# Patient Record
Sex: Female | Born: 1990 | Race: White | Hispanic: Yes | Marital: Single | Smoking: Never smoker
Health system: Southern US, Community
[De-identification: ages and names within clinical notes are randomized; demographics above are authoritative.]

## PROBLEM LIST (undated history)

## (undated) DIAGNOSIS — F419 Anxiety disorder, unspecified: Secondary | ICD-10-CM

## (undated) DIAGNOSIS — J45909 Unspecified asthma, uncomplicated: Secondary | ICD-10-CM

## (undated) DIAGNOSIS — C8190 Hodgkin lymphoma, unspecified, unspecified site: Secondary | ICD-10-CM

## (undated) DIAGNOSIS — R110 Nausea: Principal | ICD-10-CM

## (undated) DIAGNOSIS — C801 Malignant (primary) neoplasm, unspecified: Secondary | ICD-10-CM

## (undated) DIAGNOSIS — G4700 Insomnia, unspecified: Principal | ICD-10-CM

## (undated) DIAGNOSIS — R300 Dysuria: Secondary | ICD-10-CM

## (undated) HISTORY — PX: WISDOM TOOTH EXTRACTION: SHX21

## (undated) HISTORY — DX: Dysuria: R30.0

## (undated) HISTORY — DX: Insomnia, unspecified: G47.00

## (undated) HISTORY — DX: Hodgkin lymphoma, unspecified, unspecified site: C81.90

## (undated) HISTORY — DX: Nausea: R11.0

---
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
## 2012-03-16 ENCOUNTER — Emergency Department (HOSPITAL_COMMUNITY)
Admission: EM | Admit: 2012-03-16 | Discharge: 2012-03-16 | Disposition: A | Discharge status: Home / Self Care | Payer: Medicaid Other | Attending: Emergency Medicine | Admitting: Emergency Medicine

## 2012-03-16 ENCOUNTER — Emergency Department (HOSPITAL_COMMUNITY)
Admission: EM | Admit: 2012-03-16 | Discharge: 2012-03-16 | Disposition: A | Discharge status: Home / Self Care | Payer: Medicaid Other | Source: Home / Self Care

## 2012-03-16 ENCOUNTER — Encounter (HOSPITAL_COMMUNITY): Payer: Self-pay | Admitting: Emergency Medicine

## 2012-03-16 DIAGNOSIS — Z0389 Encounter for observation for other suspected diseases and conditions ruled out: Secondary | ICD-10-CM | POA: Insufficient documentation

## 2012-03-16 DIAGNOSIS — J208 Acute bronchitis due to other specified organisms: Secondary | ICD-10-CM

## 2012-03-16 HISTORY — DX: Unspecified asthma, uncomplicated: J45.909

## 2012-03-16 MED ORDER — DM-GUAIFENESIN ER 30-600 MG PO TB12: 1.0000 | ORAL_TABLET | Freq: Two times a day (BID) | ORAL | Status: AC

## 2012-03-16 MED ORDER — ALBUTEROL SULFATE HFA 108 (90 BASE) MCG/ACT IN AERS: 2.0000 | INHALATION_SPRAY | Freq: Four times a day (QID) | RESPIRATORY_TRACT | Status: DC | PRN

## 2012-03-16 NOTE — Discharge Instructions (Signed)
Warm liquids, lozenges or spray to ease pain in throat. Ibuprofen for body aches and headache.  Return in 7 days if you are not improving    Acute Bronchitis Bronchitis is when the organs and tissues involved in breathing get puffy (swollen) and can leak fluid. This makes it harder for air to get in and out of the lungs. You may cough a lot and produce thick spit (mucus). Acute means the illness started suddenly. HOME CARE  Rest.   Drink enough fluids to keep the pee (urine) clear or pale yellow.   Medicines may be given that will open up your airways to help you breathe better. Only take medicine as told by your doctor.   Use a cool mist vaporizer. This will help to thin any thick spit.   Do not smoke. Avoid secondhand smoke.  GET HELP RIGHT AWAY IF:   You have a temperature by mouth above 102 F (38.9 C), not controlled by medicine.   You have chills.   You develop severe shortness of breath or chest pain.   You have bloody spit mixed with mucus (sputum).   You throw up (vomit) often.   You lose too much body fluid (dehydrated).   You have a severe headache.   You feel faint.   You do not improve after 1 week of treatment.  MAKE SURE YOU:   Understand these instructions.   Will watch your condition.   Will get help right away if you are not doing well or get worse.  Document Released: 02/24/2008 Document Revised: 08/27/2011 Document Reviewed: 09/25/2009 Exit[employer] Patient Information 2012 [employer], [place]  Viral Infections A viral infection can be caused by different types of viruses.Most viral infections are not serious and resolve on their own. However, some infections may cause severe symptoms and may lead to further complications. SYMPTOMS Viruses can frequently cause:  Minor sore throat.   Aches and pains.   Headaches.   Runny nose.   Different types of rashes.   Watery eyes.   Tiredness.   Cough.   Loss of appetite.   Gastrointestinal  infections, resulting in nausea, vomiting, and diarrhea.  These symptoms do not respond to antibiotics because the infection is not caused by bacteria. However, you might catch a bacterial infection following the viral infection. This is sometimes called a "superinfection." Symptoms of such a bacterial infection may include:  Worsening sore throat with pus and difficulty swallowing.   Swollen neck glands.   Chills and a high or persistent fever.   Severe headache.   Tenderness over the sinuses.   Persistent overall ill feeling (malaise), muscle aches, and tiredness (fatigue).   Persistent cough.   Yellow, green, or brown mucus production with coughing.  HOME CARE INSTRUCTIONS   Only take over-the-counter or prescription medicines for pain, discomfort, diarrhea, or fever as directed by your caregiver.   Drink enough water and fluids to keep your urine clear or pale yellow. Sports drinks can provide valuable electrolytes, sugars, and hydration.   Get plenty of rest and maintain proper nutrition. Soups and broths with crackers or rice are fine.  SEEK IMMEDIATE MEDICAL CARE IF:   You have severe headaches, shortness of breath, chest pain, neck pain, or an unusual rash.   You have uncontrolled vomiting, diarrhea, or you are unable to keep down fluids.   You or your child has an oral temperature above 102 F (38.9 C), not controlled by medicine.   Your baby is older than 3  months with a rectal temperature of 102 F (38.9 C) or higher.   Your baby is 29 months old or younger with a rectal temperature of 100.4 F (38 C) or higher.  MAKE SURE YOU:   Understand these instructions.   Will watch your condition.   Will get help right away if you are not doing well or get worse.  Document Released: 06/17/2005 Document Revised: 08/27/2011 Document Reviewed: 01/12/2011 [facility] Patient Information 2012 [place], [place].Marland Kitchen

## 2012-03-16 NOTE — ED Provider Notes (Signed)
History     CSN: [id number]  Arrival date & time 03/16/12  1749   First MD Initiated Contact with Patient 03/16/12 1804      Chief Complaint  Patient presents with  . Sore Throat    (Consider location/radiation/quality/duration/timing/severity/associated sxs/prior treatment) HPI Sore throat, cough with clear sputum and body aches for 3 days. No fever, runny nose, watery eyes, ear ache or fullness in ears.  She notes wheezing and had to use her brother's inhaler for it to resolve.  Her mother has been sick with same symptoms for 2-3 days and bother for 1 wk.    Past Medical History  Diagnosis Date  . Asthma     Past Surgical History  Procedure Date  . Wisdom tooth extraction     No family history on file.  History  Substance Use Topics  . Smoking status: Never Smoker   . Smokeless tobacco: Not on file  . Alcohol Use: No    OB History    Grav Para Term Preterm Abortions TAB SAB Ect Mult Living                  Review of Systems  Constitutional: Positive for appetite change and fatigue.  HENT: Positive for sore throat and trouble swallowing. Negative for hearing loss, ear pain, nosebleeds, congestion, rhinorrhea, sneezing, drooling, mouth sores, voice change, postnasal drip, sinus pressure, tinnitus and ear discharge.   Eyes: Negative.   Respiratory: Positive for cough and wheezing. Negative for shortness of breath.   Cardiovascular: Negative.   Gastrointestinal: Negative.   Genitourinary: Negative.   Musculoskeletal: Negative.   Neurological: Negative.   Psychiatric/Behavioral: Negative.     Allergies  Review of patient's allergies indicates no known allergies.  Home Medications   Current Outpatient Rx  Name Route Sig Dispense Refill  . ALBUTEROL SULFATE HFA 108 (90 BASE) MCG/ACT IN AERS Inhalation Inhale 2 puffs into the lungs every 6 (six) hours as needed for wheezing or shortness of breath. 1 Inhaler 2  . DM-GUAIFENESIN ER 30-600 MG PO TB12 Oral  Take 1 tablet by mouth every 12 (twelve) hours. 20 tablet 0    BP 127/81  Pulse 72  Temp 99.2 F (37.3 C) (Oral)  Resp 18  SpO2 97%  Physical Exam  Constitutional: She is oriented to person, place, and time. She appears well-developed and well-nourished. No distress.  HENT:  Head: Normocephalic and atraumatic.       oropharynx erythematous Normal tonsils  Eyes: EOM are normal. Pupils are equal, round, and reactive to light. Right eye exhibits no discharge. Left eye exhibits no discharge.  Cardiovascular: Normal rate and regular rhythm.   Pulmonary/Chest: Effort normal and breath sounds normal. No stridor. No respiratory distress. She has no wheezes. She has no rales. She exhibits no tenderness.  Abdominal: Soft. Bowel sounds are normal.  Lymphadenopathy:    She has no cervical adenopathy.  Neurological: She is alert and oriented to person, place, and time.  Skin: She is not diaphoretic.  Psychiatric: She has a normal mood and affect.    ED Course  Procedures (including critical care time)  Labs Reviewed - No data to display No results found.   1. Viral bronchitis       MDM  Albuterol inhaler for wheeze, delsym DM, lozenges, warm fluids, Ibuprofen for body aches and headache.         Calvert Cantor, MD 03/16/12 2008

## 2012-03-16 NOTE — ED Notes (Signed)
C/o sore throat.  Onset this past Sunday.  C/o runny cough and aches, denies fever. Reports clear phlegm.  Also reports wheezing intermittently.

## 2012-09-29 ENCOUNTER — Encounter (HOSPITAL_COMMUNITY): Payer: Self-pay | Admitting: *Deleted

## 2012-09-29 ENCOUNTER — Emergency Department (INDEPENDENT_AMBULATORY_CARE_PROVIDER_SITE_OTHER)
Admission: EM | Admit: 2012-09-29 | Discharge: 2012-09-29 | Disposition: A | Discharge status: Home / Self Care | Payer: Self-pay | Source: Home / Self Care

## 2012-09-29 DIAGNOSIS — R1031 Right lower quadrant pain: Secondary | ICD-10-CM

## 2012-09-29 DIAGNOSIS — N390 Urinary tract infection, site not specified: Secondary | ICD-10-CM

## 2012-09-29 LAB — POCT URINALYSIS DIP (DEVICE)
Bilirubin Urine: NEGATIVE
Protein, ur: 100 mg/dL — AB
Specific Gravity, Urine: 1.02 (ref 1.005–1.030)
pH: 8.5 — ABNORMAL HIGH (ref 5.0–8.0)

## 2012-09-29 MED ORDER — CEPHALEXIN 500 MG PO CAPS: 500.0000 mg | ORAL_CAPSULE | Freq: Two times a day (BID) | ORAL | Status: DC

## 2012-09-29 NOTE — ED Notes (Signed)
States she had hematuria 2 days ago and pain was 7/10 at that time. No pain now

## 2012-09-29 NOTE — ED Notes (Signed)
C/o discomfort at urethra onset 1 week ago.  No urgency or frequency.  C/o dysuria.  Has had one before and it felt like this.  C/o itching in her vaginal area with clear discharge that she says is normal for her.

## 2012-09-29 NOTE — ED Provider Notes (Signed)
History     CSN: [id number]  Arrival date & time 09/29/12  1659   None     Chief Complaint  Patient presents with  . Urinary Tract Infection    (Consider location/radiation/quality/duration/timing/severity/associated sxs/prior treatment) Patient is a 22 y.o. female presenting with urinary tract infection. The history is provided by the patient.  Urinary Tract Infection This is a new problem.   Mallory Price is a 22 y.o. female who complains of urinary frequency, urgency and dysuria x 7 days, without flank pain, fever, chills, or abnormal vaginal discharge or bleeding.   States noted blood in urine yesterday.  Has not been taking medications for symptoms.  Has history of UTI, not frequent, last UTI 1 years ago.  Denies known kidney disease or structural abnormalities.  Past Medical History  Diagnosis Date  . Asthma     Past Surgical History  Procedure Date  . Wisdom tooth extraction     History reviewed. No pertinent family history.  History  Substance Use Topics  . Smoking status: Never Smoker   . Smokeless tobacco: Not on file  . Alcohol Use: No    OB History    Grav Para Term Preterm Abortions TAB SAB Ect Mult Living                  Review of Systems  All other systems reviewed and are negative.    Allergies  Review of patient's allergies indicates no known allergies.  Home Medications   Current Outpatient Rx  Name  Route  Sig  Dispense  Refill  . ALBUTEROL SULFATE HFA 108 (90 BASE) MCG/ACT IN AERS   Inhalation   Inhale 2 puffs into the lungs every 6 (six) hours as needed for wheezing or shortness of breath.   1 Inhaler   2   . CEPHALEXIN 500 MG PO CAPS   Oral   Take 1 capsule (500 mg total) by mouth 2 (two) times daily.   14 capsule   0     BP 118/68  Pulse 92  Temp 98.2 F (36.8 C) (Oral)  Resp 16  SpO2 100%  LMP 09/07/2012  Physical Exam  Nursing note and vitals reviewed. Constitutional: She is oriented to person, place, and  time. Vital signs are normal. She appears well-developed and well-nourished. She is active and cooperative.  HENT:  Head: Normocephalic.  Eyes: Conjunctivae normal and EOM are normal. Pupils are equal, round, and reactive to light. No scleral icterus.  Neck: Trachea normal and normal range of motion. Neck supple.  Cardiovascular: Normal rate, regular rhythm, normal heart sounds and intact distal pulses.   Pulmonary/Chest: Effort normal and breath sounds normal.  Abdominal: Soft. Bowel sounds are normal. There is tenderness in the right lower quadrant. There is no rebound, no guarding and no CVA tenderness.         Negative psoas and markle sign  Neurological: She is alert and oriented to person, place, and time. No cranial nerve deficit or sensory deficit.  Skin: Skin is warm and dry.  Psychiatric: She has a normal mood and affect. Her speech is normal and behavior is normal. Judgment and thought content normal. Cognition and memory are normal.    ED Course  Procedures (including critical care time)  Labs Reviewed  POCT URINALYSIS DIP (DEVICE) - Abnormal; Notable for the following:    Ketones, ur TRACE (*)     Hgb urine dipstick MODERATE (*)     pH 8.5 (*)  Protein, ur 100 (*)     Urobilinogen, UA 2.0 (*)     Leukocytes, UA LARGE (*)  Biochemical Testing Only. Please order routine urinalysis from main lab if confirmatory testing is needed.   All other components within normal limits  POCT PREGNANCY, URINE   No results found.   1. RLQ abdominal pain   2. UTI (lower urinary tract infection)       MDM  RLQ tenderness on exam, upon further interview pt reports intermittent spasmodic pain for about one month that she believed may have been related to her ovary, although no known history of ovarian cysts.  Pain is unpredictable, no known fever, anorexia or n/v/d.  Discussed findings with patient, made aware CT scan would rule out acute abdominal etiology of pain.  Pt declines  transfer to [facility] for imaging related to lack of insurance, risks reviewed.  Medications as prescribed, increase fluids.            Johnsie Kindred, NP 09/29/12 2130

## 2012-09-30 NOTE — ED Provider Notes (Signed)
Medical screening examination/treatment/procedure(s) were performed by non-physician practitioner and as supervising physician I was immediately available for consultation/collaboration.  Dondre Catalfamo   Shunte Senseney, MD 09/30/12 1001 

## 2013-01-03 ENCOUNTER — Encounter (HOSPITAL_COMMUNITY): Payer: Self-pay | Admitting: Emergency Medicine

## 2013-01-03 ENCOUNTER — Emergency Department (INDEPENDENT_AMBULATORY_CARE_PROVIDER_SITE_OTHER)
Admission: EM | Admit: 2013-01-03 | Discharge: 2013-01-03 | Disposition: A | Discharge status: Home / Self Care | Payer: Self-pay | Source: Home / Self Care | Attending: Emergency Medicine | Admitting: Emergency Medicine

## 2013-01-03 DIAGNOSIS — N390 Urinary tract infection, site not specified: Secondary | ICD-10-CM

## 2013-01-03 LAB — POCT URINALYSIS DIP (DEVICE)
Ketones, ur: NEGATIVE mg/dL
Protein, ur: NEGATIVE mg/dL
Urobilinogen, UA: 0.2 mg/dL (ref 0.0–1.0)
pH: 8.5 — ABNORMAL HIGH (ref 5.0–8.0)

## 2013-01-03 LAB — POCT PREGNANCY, URINE: Preg Test, Ur: NEGATIVE

## 2013-01-03 MED ORDER — CIPROFLOXACIN HCL 500 MG PO TABS: 500.0000 mg | ORAL_TABLET | Freq: Two times a day (BID) | ORAL | Status: DC

## 2013-01-03 MED ORDER — PHENAZOPYRIDINE HCL 200 MG PO TABS: 200.0000 mg | ORAL_TABLET | Freq: Three times a day (TID) | ORAL | Status: DC | PRN

## 2013-01-03 NOTE — ED Provider Notes (Signed)
Chief Complaint:   Chief Complaint  Patient presents with  . Urinary Tract Infection    History of Present Illness:   Mallory Price is a 22 year old female with a two-day history of dysuria, frequency, urgency, nausea, suprapubic pain, and urethral itching and irritation. She has had 2 previous urinary tract infections, last one 2 months ago which was treated with cephalexin. A culture was not obtained at that time. She denies fever, chills, vomiting, and she has had chronic lower back pain but nothing acute or new. She denies any GYN problems.  Review of Systems:  Other than noted above, the patient denies any of the following symptoms: General:  No fevers, chills, sweats, aches, or fatigue. GI:  No abdominal pain, back pain, nausea, vomiting, diarrhea, or constipation. GU:  No dysuria, frequency, urgency, hematuria, or incontinence. GYN:  No discharge, itching, vulvar pain or lesions, pelvic pain, or abnormal vaginal bleeding.  PMFSH:  Past medical history, family history, social history, meds, and allergies were reviewed.    Physical Exam:   Vital signs:  BP 105/71  Pulse 74  Temp(Src) 98 F (36.7 C) (Oral)  Resp 16  SpO2 100%  LMP 01/01/2013 Gen:  Alert, oriented, in no distress. Lungs:  Clear to auscultation, no wheezes, rales or rhonchi. Heart:  Regular rhythm, no gallop or murmer. Abdomen:  Flat and soft. There was slight suprapubic pain to palpation.  No guarding, or rebound.  No hepato-splenomegaly or mass.  Bowel sounds were normally active.  No hernia. Back:  No CVA tenderness.  Skin:  Clear, warm and dry.  Labs:   Results for orders placed during the hospital encounter of 01/03/13  POCT URINALYSIS DIP (DEVICE)      Result Value Range   Glucose, UA NEGATIVE  NEGATIVE mg/dL   Bilirubin Urine NEGATIVE  NEGATIVE   Ketones, ur NEGATIVE  NEGATIVE mg/dL   Specific Gravity, Urine 1.015  1.005 - 1.030   Hgb urine dipstick LARGE (*) NEGATIVE   pH 8.5 (*) 5.0 - 8.0   Protein, ur NEGATIVE  NEGATIVE mg/dL   Urobilinogen, UA 0.2  0.0 - 1.0 mg/dL   Nitrite NEGATIVE  NEGATIVE   Leukocytes, UA SMALL (*) NEGATIVE  POCT PREGNANCY, URINE      Result Value Range   Preg Test, Ur NEGATIVE  NEGATIVE     Other Labs Obtained at Urgent Care Center:  A urine culture was obtained.  Results are pending at this time and we will call about any positive results.  Assessment: The encounter diagnosis was UTI (lower urinary tract infection).   This is a recurrent urinary tract infection. Since she was treated with cephalexin last time I will try Cipro this time. A culture was obtained.  Plan:   1.  The following meds were prescribed:   Discharge Medication List as of 01/03/2013  7:20 PM    START taking these medications   Details  ciprofloxacin (CIPRO) 500 MG tablet Take 1 tablet (500 mg total) by mouth every 12 (twelve) hours., Starting 01/03/2013, Until Discontinued, Normal    phenazopyridine (PYRIDIUM) 200 MG tablet Take 1 tablet (200 mg total) by mouth 3 (three) times daily as needed for pain., Starting 01/03/2013, Until Discontinued, Normal       2.  The patient was instructed in symptomatic care and handouts were given. 3.  The patient was told to return if becoming worse in any way, if no better in 3 or 4 days, and given some red flag symptoms  such as fever, chills, increasing pain, or vomiting that would indicate earlier return. 4.  The patient was told to avoid intercourse for 10 days, get extra fluids, and return for a follow up with her primary care doctor at the completion of treatment for a repeat UA and culture.     Reuben Likes, MD 01/03/13 2016

## 2013-01-03 NOTE — ED Notes (Signed)
C/o itching in urethra and burning with urination, frequency of urination onset yesterday.

## 2013-01-05 LAB — URINE CULTURE
Colony Count: 50000
Special Requests: NORMAL

## 2013-01-05 NOTE — ED Notes (Signed)
Urine culture: 50,000 colonies E. Coli. Pt. adequately treated with Cipro. Mallory Price 01/05/2013

## 2015-01-30 ENCOUNTER — Emergency Department (HOSPITAL_COMMUNITY)
Admission: EM | Admit: 2015-01-30 | Discharge: 2015-01-31 | Disposition: A | Discharge status: Home / Self Care | Payer: BLUE CROSS/BLUE SHIELD | Attending: Emergency Medicine | Admitting: Emergency Medicine

## 2015-01-30 ENCOUNTER — Encounter (HOSPITAL_COMMUNITY): Payer: Self-pay | Admitting: Emergency Medicine

## 2015-01-30 DIAGNOSIS — Z3202 Encounter for pregnancy test, result negative: Secondary | ICD-10-CM | POA: Insufficient documentation

## 2015-01-30 DIAGNOSIS — Z79899 Other long term (current) drug therapy: Secondary | ICD-10-CM | POA: Insufficient documentation

## 2015-01-30 DIAGNOSIS — R079 Chest pain, unspecified: Secondary | ICD-10-CM | POA: Diagnosis present

## 2015-01-30 DIAGNOSIS — R000 Tachycardia, unspecified: Secondary | ICD-10-CM | POA: Diagnosis not present

## 2015-01-30 DIAGNOSIS — J45901 Unspecified asthma with (acute) exacerbation: Secondary | ICD-10-CM | POA: Diagnosis not present

## 2015-01-30 DIAGNOSIS — R590 Localized enlarged lymph nodes: Secondary | ICD-10-CM | POA: Insufficient documentation

## 2015-01-30 LAB — BASIC METABOLIC PANEL
Anion gap: 11 (ref 5–15)
BUN: 6 mg/dL (ref 6–20)
CO2: 22 mmol/L (ref 22–32)
CREATININE: 0.63 mg/dL (ref 0.44–1.00)
Calcium: 8.8 mg/dL — ABNORMAL LOW (ref 8.9–10.3)
Chloride: 106 mmol/L (ref 101–111)
GLUCOSE: 110 mg/dL — AB (ref 70–99)
POTASSIUM: 3.3 mmol/L — AB (ref 3.5–5.1)
Sodium: 139 mmol/L (ref 135–145)

## 2015-01-30 LAB — CBC WITH DIFFERENTIAL/PLATELET
BASOS ABS: 0 10*3/uL (ref 0.0–0.1)
BASOS PCT: 0 % (ref 0–1)
EOS ABS: 0.1 10*3/uL (ref 0.0–0.7)
Eosinophils Relative: 2 % (ref 0–5)
HCT: 36.9 % (ref 36.0–46.0)
HEMOGLOBIN: 12.5 g/dL (ref 12.0–15.0)
Lymphocytes Relative: 26 % (ref 12–46)
Lymphs Abs: 2.3 10*3/uL (ref 0.7–4.0)
MCH: 31.1 pg (ref 26.0–34.0)
MCHC: 33.9 g/dL (ref 30.0–36.0)
MCV: 91.8 fL (ref 78.0–100.0)
Monocytes Absolute: 0.6 10*3/uL (ref 0.1–1.0)
Monocytes Relative: 6 % (ref 3–12)
NEUTROS ABS: 5.8 10*3/uL (ref 1.7–7.7)
NEUTROS PCT: 66 % (ref 43–77)
PLATELETS: 234 10*3/uL (ref 150–400)
RBC: 4.02 MIL/uL (ref 3.87–5.11)
RDW: 12.9 % (ref 11.5–15.5)
WBC: 8.8 10*3/uL (ref 4.0–10.5)

## 2015-01-30 LAB — I-STAT TROPONIN, ED: Troponin i, poc: 0 ng/mL (ref 0.00–0.08)

## 2015-01-30 LAB — D-DIMER, QUANTITATIVE: D-Dimer, Quant: 1 ug/mL-FEU — ABNORMAL HIGH (ref 0.00–0.48)

## 2015-01-30 LAB — I-STAT BETA HCG BLOOD, ED (MC, WL, AP ONLY): I-stat hCG, quantitative: <5 m[IU]/mL (ref <5)

## 2015-01-30 MED ORDER — ASPIRIN 325 MG PO TABS: 325.0000 mg | ORAL_TABLET | ORAL | Status: DC

## 2015-01-30 MED ORDER — MORPHINE SULFATE 4 MG/ML IJ SOLN
4.0000 mg | Freq: Once | INTRAMUSCULAR | Status: AC
  Administered 2015-01-30: 4 mg via INTRAVENOUS
  Filled 2015-01-30: qty 1

## 2015-01-30 MED ORDER — ONDANSETRON HCL 4 MG/2ML IJ SOLN
4.0000 mg | Freq: Once | INTRAMUSCULAR | Status: AC
  Administered 2015-01-30: 4 mg via INTRAVENOUS
  Filled 2015-01-30: qty 2

## 2015-01-30 NOTE — ED Notes (Signed)
Sent to ED to be evaluated for a PE, sent by Dr. Amedeo Kinsman. Reports ST changes.

## 2015-01-30 NOTE — ED Provider Notes (Signed)
CSN: [id number]     Arrival date & time 01/30/15  2106 History   First MD Initiated Contact with Patient 01/30/15 2151     Chief Complaint  Patient presents with  . Chest Pain    The patient said she has been having pain in her abdomen under her rib that radiates to the top of her right shoulder and to her back.    . Shortness of Breath     (Consider location/radiation/quality/duration/timing/severity/associated sxs/prior Treatment) HPI Comments: Patient is a 24 year old female presenting to the emergency department from her PCPs office for evaluation of right-sided pleuritic chest pain with radiation to her back that began last evening. Patient states her pain is worsened with inspiration and leaning forward. States she took a pain pill last evening with some improvement of her pain. She endorses associated shortness of breath, lightheadedness and dizziness. Denies any fevers, cough, nausea, vomiting. No history of DVT or PE. Patient is on NuvaRing.  Patient is a 24 y.o. female presenting with chest pain and shortness of breath.  Chest Pain Associated symptoms: shortness of breath   Shortness of Breath Associated symptoms: chest pain     Past Medical History  Diagnosis Date  . Asthma    Past Surgical History  Procedure Laterality Date  . Wisdom tooth extraction     History reviewed. No pertinent family history. History  Substance Use Topics  . Smoking status: Never Smoker   . Smokeless tobacco: Not on file  . Alcohol Use: No   OB History    No data available     Review of Systems  Respiratory: Positive for shortness of breath.   Cardiovascular: Positive for chest pain.  All other systems reviewed and are negative.     Allergies  Review of patient's allergies indicates no known allergies.  Home Medications   Prior to Admission medications   Medication Sig Start Date End Date Taking? Authorizing Provider  etonogestrel-ethinyl estradiol (NUVARING) 0.12-0.015  MG/24HR vaginal ring Place 1 each vaginally every 28 (twenty-eight) days. Insert vaginally and leave in place for 3 consecutive weeks, then remove for 1 week.   Yes Historical Provider, MD  albuterol (PROVENTIL HFA;VENTOLIN HFA) 108 (90 BASE) MCG/ACT inhaler Inhale 2 puffs into the lungs every 6 (six) hours as needed for wheezing or shortness of breath. 03/16/12 03/16/13  Debbe Odea, MD  cephALEXin (KEFLEX) 500 MG capsule Take 1 capsule (500 mg total) by mouth 2 (two) times daily. Patient not taking: Reported on 01/30/2015 09/29/12   Awilda Metro, NP  ciprofloxacin (CIPRO) 500 MG tablet Take 1 tablet (500 mg total) by mouth every 12 (twelve) hours. Patient not taking: Reported on 01/30/2015 01/03/13   Harden Mo, MD  phenazopyridine (PYRIDIUM) 200 MG tablet Take 1 tablet (200 mg total) by mouth 3 (three) times daily as needed for pain. Patient not taking: Reported on 01/30/2015 01/03/13   Harden Mo, MD   BP 108/69 mmHg  Pulse 88  Temp(Src) 98.4 F (36.9 C) (Oral)  Resp 16  Ht 5' (1.524 m)  Wt 126 lb 6.4 oz (57.335 kg)  BMI 24.69 kg/m2  SpO2 99%  LMP 01/16/2015 Physical Exam  Constitutional: She is oriented to person, place, and time. She appears well-developed and well-nourished. No distress.  HENT:  Head: Normocephalic and atraumatic.  Right Ear: External ear normal.  Left Ear: External ear normal.  Nose: Nose normal.  Mouth/Throat: Oropharynx is clear and moist.  Eyes: Conjunctivae are normal.  Neck: Normal range  of motion. Neck supple.  No nuchal rigidity.   Cardiovascular: Regular rhythm and normal heart sounds.  Tachycardia present.   Pulmonary/Chest: Effort normal and breath sounds normal. No respiratory distress. She exhibits no tenderness.  Abdominal: Soft. There is no tenderness.  Musculoskeletal: Normal range of motion. She exhibits no edema.  Neurological: She is alert and oriented to person, place, and time.  Skin: Skin is warm and dry. She is not diaphoretic.   Psychiatric: She has a normal mood and affect.  Nursing note and vitals reviewed.   ED Course  Procedures (including critical care time) Medications  morphine 4 MG/ML injection 4 mg (4 mg Intravenous Given 01/30/15 2223)  ondansetron (ZOFRAN) injection 4 mg (4 mg Intravenous Given 01/30/15 2223)  iohexol (OMNIPAQUE) 350 MG/ML injection 100 mL (100 mLs Intravenous Contrast Given 01/31/15 0032)  ketorolac (TORADOL) 30 MG/ML injection 30 mg (30 mg Intravenous Given 01/31/15 0101)    Labs Review Labs Reviewed  BASIC METABOLIC PANEL - Abnormal; Notable for the following:    Potassium 3.3 (*)    Glucose, Bld 110 (*)    Calcium 8.8 (*)    All other components within normal limits  D-DIMER, QUANTITATIVE - Abnormal; Notable for the following:    D-Dimer, Quant 1.00 (*)    All other components within normal limits  CBC WITH DIFFERENTIAL/PLATELET  I-STAT TROPOININ, ED  I-STAT BETA HCG BLOOD, ED ([facility], [facility], [facility] ONLY)    Imaging Review Ct Angio Chest Pe W/cm &/or Wo Cm  01/31/2015   CLINICAL DATA:  Abdominal pain under the rib radiating to the top of the right shoulder and to the back. Shortness of breath, dizziness, and lightheadedness. Elevated D-dimer.  EXAM: CT ANGIOGRAPHY CHEST WITH CONTRAST  TECHNIQUE: Multidetector CT imaging of the chest was performed using the standard protocol during bolus administration of intravenous contrast. Multiplanar CT image reconstructions and MIPs were obtained to evaluate the vascular anatomy.  CONTRAST:  127mL OMNIPAQUE IOHEXOL 350 MG/ML SOLN  COMPARISON:  None.  FINDINGS: Technically adequate study with good opacification of the central and segmental pulmonary arteries. No focal filling defects demonstrated. No evidence of significant pulmonary embolus.  Normal heart size. Normal caliber thoracic aorta. No aortic dissection. Great vessel origins are patent. Esophagus is decompressed. Anterior mediastinal mass measuring 6.2 by 3.6 cm. Right paratracheal and  pretracheal lymphadenopathy. Mild subcarinal lymphadenopathy. Right pretracheal lymphadenopathy measures 2.1 cm short axis dimension. Bilateral supraclavicular lymphadenopathy, greatest on the left with measurement of 2.9 cm short axis dimension. No significant axillary or hilar lymphadenopathy. Appearance is worrisome for lymphoma.  Mild dependent changes in the lung bases. No focal airspace disease or consolidation. Airways are patent.  Included portions of the upper abdominal organs are grossly unremarkable. Mild anterior compression and circumscribed central defect in a lower thoracic vertebra, probably T10. This may represent involvement of the vertebra with lymphoma.  Review of the MIP images confirms the above findings.  IMPRESSION: No evidence of significant pulmonary embolus. Prominent mediastinal and supraclavicular lymphadenopathy worrisome for lymphoma. Anterior compression and central defect in the body of T10 may indicate involvement of vertebra. No evidence of active pulmonary disease.   Electronically Signed   By: Lucienne Capers M.D.   On: 01/31/2015 01:06     EKG Interpretation   Date/Time:  Wednesday Jan 30 2015 21:11:37 EDT Ventricular Rate:  83 PR Interval:  148 QRS Duration: 72 QT Interval:  356 QTC Calculation: 418 R Axis:   87 Text Interpretation:  Normal sinus rhythm Normal  ECG Sinus rhythm Early  repolarization pattern Borderline ECG Confirmed by Carmin Muskrat  MD  [phone]) on 01/31/2015 12:20:30 AM      On re-evaluation palpable left sided supraclavicular lymph node noted.   MDM   Final diagnoses:  Lymphadenopathy, supraclavicular  Lymphadenopathy, mediastinal    Filed Vitals:   01/31/15 0225  BP:   Pulse:   Temp: 98.4 F (36.9 C)  Resp:    Afebrile, NAD, non-toxic appearing, AAOx4.  I have reviewed nursing notes, vital signs, and all appropriate lab and imaging results if ordered as above.   Patient presenting to the emergency department from [facility]  walk-in clinic for evaluation of possible PE. Patient is mildly tachycardic on examination without acute distress. Lungs clear to auscultation bilaterally. Chest pain is not reproducible. D-dimer is elevated at labs are otherwise unremarkable. CT angiography chest obtained. No evidence of PE. Left supraclavicular and prominent mediastinal lymphadenopathy noted, worrisome for lymphoma. Dr. Venora Maples and myself discussed these results with the patient as well as her father. Discussed the importance of timely follow-up for further evaluation of lymphadenopathy. Will send patient to ENT for biopsy of supraclavicular lymph node. Return precautions discussed. Patient is agreeable to plan. Patient stable at time of discharge. Patient d/w with Dr. Venora Maples, agrees with plan.    Baron Sane, PA-C 01/31/15 [place], MD 01/31/15 [phone]

## 2015-01-30 NOTE — ED Notes (Addendum)
The patient said she has been having pain in her abdomen under her rib that radiates to the top of her right shoulder and to her back.  The patient has  SOB, dizziness, and lightheadedness but denies anything else.  The patient rates it 6/10.  She was sent from [facility] to R/O PE.

## 2015-01-31 ENCOUNTER — Emergency Department (HOSPITAL_COMMUNITY): Payer: BLUE CROSS/BLUE SHIELD

## 2015-01-31 ENCOUNTER — Encounter (HOSPITAL_COMMUNITY): Payer: Self-pay | Admitting: Radiology

## 2015-01-31 MED ORDER — KETOROLAC TROMETHAMINE 30 MG/ML IJ SOLN
30.0000 mg | Freq: Once | INTRAMUSCULAR | Status: AC
  Administered 2015-01-31: 30 mg via INTRAVENOUS
  Filled 2015-01-31: qty 1

## 2015-01-31 MED ORDER — IOHEXOL 350 MG/ML SOLN
100.0000 mL | Freq: Once | INTRAVENOUS | Status: AC | PRN
  Administered 2015-01-31: 100 mL via INTRAVENOUS

## 2015-01-31 NOTE — Discharge Instructions (Signed)
Please follow up with your primary care physician in 1-2 days. If you do not have one please call the [place] number listed above. Please follow up with Dr. Wilburn Cornelia to schedule a follow up appointment.  Please read all discharge instructions and return precautions.    Lymphadenopathy Lymphadenopathy means "disease of the lymph glands." But the term is usually used to describe swollen or enlarged lymph glands, also called lymph nodes. These are the bean-shaped organs found in many locations including the neck, underarm, and groin. Lymph glands are part of the immune system, which fights infections in your body. Lymphadenopathy can occur in just one area of the body, such as the neck, or it can be generalized, with lymph node enlargement in several areas. The nodes found in the neck are the most common sites of lymphadenopathy. CAUSES When your immune system responds to germs (such as viruses or bacteria ), infection-fighting cells and fluid build up. This causes the glands to grow in size. Usually, this is not something to worry about. Sometimes, the glands themselves can become infected and inflamed. This is called lymphadenitis. Enlarged lymph nodes can be caused by many diseases:  Bacterial disease, such as strep throat or a skin infection.  Viral disease, such as a common cold.  Other germs, such as Lyme disease, tuberculosis, or sexually transmitted diseases.  Cancers, such as lymphoma (cancer of the lymphatic system) or leukemia (cancer of the white blood cells).  Inflammatory diseases such as lupus or rheumatoid arthritis.  Reactions to medications. Many of the diseases above are rare, but important. This is why you should see your caregiver if you have lymphadenopathy. SYMPTOMS  Swollen, enlarged lumps in the neck, back of the head, or other locations.  Tenderness.  Warmth or redness of the skin over the lymph nodes.  Fever. DIAGNOSIS Enlarged lymph  nodes are often near the source of infection. They can help health care providers diagnose your illness. For instance:  Swollen lymph nodes around the jaw might be caused by an infection in the mouth.  Enlarged glands in the neck often signal a throat infection.  Lymph nodes that are swollen in more than one area often indicate an illness caused by a virus. Your caregiver will likely know what is causing your lymphadenopathy after listening to your history and examining you. Blood tests, x-rays, or other tests may be needed. If the cause of the enlarged lymph node cannot be found, and it does not go away by itself, then a biopsy may be needed. Your caregiver will discuss this with you. TREATMENT Treatment for your enlarged lymph nodes will depend on the cause. Many times the nodes will shrink to normal size by themselves, with no treatment. Antibiotics or other medicines may be needed for infection. Only take over-the-counter or prescription medicines for pain, discomfort, or fever as directed by your caregiver. HOME CARE INSTRUCTIONS Swollen lymph glands usually return to normal when the underlying medical condition goes away. If they persist, contact your health-care provider. He/she might prescribe antibiotics or other treatments, depending on the diagnosis. Take any medications exactly as prescribed. Keep any follow-up appointments made to check on the condition of your enlarged nodes. SEEK MEDICAL CARE IF:  Swelling lasts for more than two weeks.  You have symptoms such as weight loss, night sweats, fatigue, or fever that does not go away.  The lymph nodes are hard, seem fixed to the skin, or are growing rapidly.  Skin over the lymph  nodes is red and inflamed. This could mean there is an infection. SEEK IMMEDIATE MEDICAL CARE IF:  Fluid starts leaking from the area of the enlarged lymph node.  You develop a fever of 102 F (38.9 C) or greater.  Severe pain develops (not necessarily  at the site of a large lymph node).  You develop chest pain or shortness of breath.  You develop worsening abdominal pain. MAKE SURE YOU:  Understand these instructions.  Will watch your condition.  Will get help right away if you are not doing well or get worse. Document Released: 06/16/2008 Document Revised: 01/22/2014 Document Reviewed: 06/16/2008 [facility] Patient Information 2015 [place], [place]. This information is not intended to replace advice given to you by your health care provider. Make sure you discuss any questions you have with your health care provider.

## 2015-02-01 ENCOUNTER — Other Ambulatory Visit (HOSPITAL_COMMUNITY)
Admission: RE | Admit: 2015-02-01 | Discharge: 2015-02-01 | Disposition: A | Discharge status: Home / Self Care | Payer: BLUE CROSS/BLUE SHIELD | Source: Ambulatory Visit | Attending: Otolaryngology | Admitting: Otolaryngology

## 2015-02-01 ENCOUNTER — Other Ambulatory Visit: Payer: Self-pay | Admitting: Otolaryngology

## 2015-02-01 DIAGNOSIS — R221 Localized swelling, mass and lump, neck: Secondary | ICD-10-CM | POA: Insufficient documentation

## 2015-02-03 DIAGNOSIS — C801 Malignant (primary) neoplasm, unspecified: Secondary | ICD-10-CM

## 2015-02-03 HISTORY — DX: Malignant (primary) neoplasm, unspecified: C80.1

## 2015-02-06 ENCOUNTER — Ambulatory Visit: Payer: Self-pay | Admitting: Otolaryngology

## 2015-02-06 ENCOUNTER — Encounter (HOSPITAL_BASED_OUTPATIENT_CLINIC_OR_DEPARTMENT_OTHER): Payer: Self-pay | Admitting: *Deleted

## 2015-02-06 NOTE — H&P (Signed)
  Assessment  Cervical lymphadenopathy (785.6) (R59.0). Mediastinal lymphadenopathy (785.6) (R59.0). Discussed  Lower cervical and mediastinal adenopathy. Concerning for possible lymphoma. FNA performed. We will discuss results when available. Reason For Visit  Pt here for evaluation of swollen lymph node. HPI  Previously healthy young lady, presented to emergency department couple of days ago with right-sided lower chest pain. During evaluation for possible pulmonary embolus, she was found to have mediastinal adenopathy and a left supraclavicular adenopathy. She hasn't had any recent constitutional symptoms. She does have cats. Allergies  No Known Drug Allergies. Current Meds  NuvaRing RING;; RPT. Personal Hx  Caffeine use (V49.89) (F15.90) Never a smoker Social alcohol use (Z78.9). ROS  12 system ROS was obtained and reviewed on the Health Maintenance form dated today.  Positive responses are shown above.  If the symptom is not checked, the patient has denied it. Vital Signs   Recorded by Sallyanne Kuster on 01 Feb 2015 09:32 AM BP:102/72,  HR: 79 b/min,  Height: 5 ft 5 in, Weight: 126 lb , BMI: 21 kg/m2,  BMI Calculated: 20.97 ,  BSA Calculated: 1.63. Physical Exam  APPEARANCE: Well developed, well nourished, in no acute distress.  Normal affect, in a pleasant mood.  Oriented to time, place and person. COMMUNICATION: Normal voice   HEAD & FACE:  No scars, lesions or masses of head and face.  Sinuses nontender to palpation.  Salivary glands without mass or tenderness.  Facial strength symmetric.  No facial lesion, scars, or mass. EYES: EOMI with normal primary gaze alignment. Visual acuity grossly intact.  PERRLA EXTERNAL EAR & NOSE: No scars, lesions or masses  EAC & TYMPANIC MEMBRANE:  EAC shows no obstructing lesions or debris and tympanic membranes are normal bilaterally with good movement to insufflation. GROSS HEARING: Normal  TMJ:  Nontender  INTRANASAL EXAM: No polyps or  purulence.  NASOPHARYNX: Normal, without lesions. LIPS, TEETH & GUMS: No lip lesions, normal dentition and normal gums. ORAL CAVITY/OROPHARYNX:  Oral mucosa moist without lesion or asymmetry of the palate, tongue, tonsil or posterior pharynx. LARYNX (mirror exam):  No lesions of the epiglottis, false cord or TVC's and cords move well to phonation. HYPOPHARYNX (mirror exam): No lesions, asymmetry or pooling of secretions. NECK:  Supple with normal range of motion. THYROID:  Normal with no masses palpable.  NEUROLOGIC:  No gross CN deficits. No nystagmus noted.   LYMPHATIC: Firm and slightly tender left supraclavicular adenopathy palpable. There are no palpable axillary nodes on either side. There is no palpable abdominal mass although she is tender in the mid epigastrium area. There is shotty slightly tender bilateral inguinal adenopathy. Procedure  FNA The risks and benefits of this procedure have been thoroughly discussed with the patient.  The most commons risks outlined included but were not limited to: injury  to the nasal mucosa or throat irritation.  The patient was further informed that there are other less common risks.  The patient was given the opportunity to ask questions and all such questions were answered to the patient's satisfaction.  Patient acknowledged the risks and has agreed to proceed.   Preop Diagnosis: [Supraclavicular adenopathy  ] Procedure: Using a 10 cc syringe with a 22 gauge needle, the mass aspirated. This was repeated with a separate needle a second time. Cytology samples were prepared. A dressing was applied. Tolerance: Excellent Unplanned interventions: None  Unplanned events: No complications. Signature  Electronically signed by : Izora Gala  M.D.; 02/01/2015 10:36 AM EST.

## 2015-02-08 ENCOUNTER — Encounter (HOSPITAL_BASED_OUTPATIENT_CLINIC_OR_DEPARTMENT_OTHER): Payer: Self-pay | Admitting: Anesthesiology

## 2015-02-08 ENCOUNTER — Ambulatory Visit (HOSPITAL_BASED_OUTPATIENT_CLINIC_OR_DEPARTMENT_OTHER)
Admission: RE | Admit: 2015-02-08 | Discharge: 2015-02-08 | Disposition: A | Discharge status: Home / Self Care | Payer: BLUE CROSS/BLUE SHIELD | Source: Ambulatory Visit | Attending: Otolaryngology | Admitting: Otolaryngology

## 2015-02-08 ENCOUNTER — Ambulatory Visit (HOSPITAL_BASED_OUTPATIENT_CLINIC_OR_DEPARTMENT_OTHER): Payer: BLUE CROSS/BLUE SHIELD | Admitting: Anesthesiology

## 2015-02-08 ENCOUNTER — Encounter (HOSPITAL_BASED_OUTPATIENT_CLINIC_OR_DEPARTMENT_OTHER)
Admission: RE | Disposition: A | Discharge status: Home / Self Care | Payer: Self-pay | Source: Ambulatory Visit | Attending: Otolaryngology

## 2015-02-08 DIAGNOSIS — J45909 Unspecified asthma, uncomplicated: Secondary | ICD-10-CM | POA: Insufficient documentation

## 2015-02-08 DIAGNOSIS — C8111 Nodular sclerosis classical Hodgkin lymphoma, lymph nodes of head, face, and neck: Secondary | ICD-10-CM | POA: Insufficient documentation

## 2015-02-08 DIAGNOSIS — R590 Localized enlarged lymph nodes: Secondary | ICD-10-CM | POA: Diagnosis present

## 2015-02-08 HISTORY — DX: Anxiety disorder, unspecified: F41.9

## 2015-02-08 HISTORY — DX: Malignant (primary) neoplasm, unspecified: C80.1

## 2015-02-08 HISTORY — PX: LYMPH NODE BIOPSY: SHX201

## 2015-02-08 LAB — POCT HEMOGLOBIN-HEMACUE: Hemoglobin: 14.3 g/dL (ref 12.0–15.0)

## 2015-02-08 SURGERY — LYMPH NODE BIOPSY
Anesthesia: General | Site: Neck | Laterality: Left

## 2015-02-08 MED ORDER — LIDOCAINE HCL (CARDIAC) 20 MG/ML IV SOLN
INTRAVENOUS | Status: DC | PRN
  Administered 2015-02-08: 50 mg via INTRAVENOUS

## 2015-02-08 MED ORDER — DIPHENHYDRAMINE HCL 25 MG PO CAPS: 25.0000 mg | ORAL_CAPSULE | Freq: Once | ORAL | Status: DC

## 2015-02-08 MED ORDER — PROPOFOL 10 MG/ML IV BOLUS
INTRAVENOUS | Status: DC | PRN
  Administered 2015-02-08: 200 mg via INTRAVENOUS

## 2015-02-08 MED ORDER — OXYCODONE HCL 5 MG PO TABS
5.0000 mg | ORAL_TABLET | Freq: Once | ORAL | Status: AC | PRN
  Administered 2015-02-08: 5 mg via ORAL

## 2015-02-08 MED ORDER — PROMETHAZINE HCL 25 MG RE SUPP: 25.0000 mg | Freq: Four times a day (QID) | RECTAL | Status: DC | PRN

## 2015-02-08 MED ORDER — HYDROMORPHONE HCL 1 MG/ML IJ SOLN
INTRAMUSCULAR | Status: AC
  Filled 2015-02-08: qty 1

## 2015-02-08 MED ORDER — ONDANSETRON HCL 4 MG/2ML IJ SOLN
INTRAMUSCULAR | Status: DC | PRN
  Administered 2015-02-08: 4 mg via INTRAVENOUS

## 2015-02-08 MED ORDER — DIPHENHYDRAMINE HCL 50 MG/ML IJ SOLN
INTRAMUSCULAR | Status: AC
  Filled 2015-02-08: qty 1

## 2015-02-08 MED ORDER — MIDAZOLAM HCL 2 MG/2ML IJ SOLN
INTRAMUSCULAR | Status: AC
  Filled 2015-02-08: qty 2

## 2015-02-08 MED ORDER — OXYCODONE HCL 5 MG PO TABS
ORAL_TABLET | ORAL | Status: AC
  Filled 2015-02-08: qty 1

## 2015-02-08 MED ORDER — MEPERIDINE HCL 25 MG/ML IJ SOLN: 6.2500 mg | INTRAMUSCULAR | Status: DC | PRN

## 2015-02-08 MED ORDER — FENTANYL CITRATE (PF) 100 MCG/2ML IJ SOLN
INTRAMUSCULAR | Status: AC
  Filled 2015-02-08: qty 4

## 2015-02-08 MED ORDER — LACTATED RINGERS IV SOLN
INTRAVENOUS | Status: DC
Start: 2015-02-08 — End: 2015-02-08
  Administered 2015-02-08 (×2): via INTRAVENOUS

## 2015-02-08 MED ORDER — HYDROMORPHONE HCL 1 MG/ML IJ SOLN
0.2500 mg | INTRAMUSCULAR | Status: DC | PRN
  Administered 2015-02-08 (×3): 0.5 mg via INTRAVENOUS

## 2015-02-08 MED ORDER — FENTANYL CITRATE (PF) 100 MCG/2ML IJ SOLN
INTRAMUSCULAR | Status: DC | PRN
  Administered 2015-02-08: 50 ug via INTRAVENOUS
  Administered 2015-02-08: 100 ug via INTRAVENOUS
  Administered 2015-02-08: 25 ug via INTRAVENOUS

## 2015-02-08 MED ORDER — CEFAZOLIN SODIUM-DEXTROSE 2-3 GM-% IV SOLR
2.0000 g | INTRAVENOUS | Status: AC
  Administered 2015-02-08: 2 g via INTRAVENOUS

## 2015-02-08 MED ORDER — OXYMETAZOLINE HCL 0.05 % NA SOLN
NASAL | Status: AC
  Filled 2015-02-08: qty 15

## 2015-02-08 MED ORDER — DIPHENHYDRAMINE HCL 50 MG/ML IJ SOLN
50.0000 mg | Freq: Once | INTRAMUSCULAR | Status: AC
  Administered 2015-02-08: 25 mg via INTRAVENOUS

## 2015-02-08 MED ORDER — MIDAZOLAM HCL 5 MG/5ML IJ SOLN
INTRAMUSCULAR | Status: DC | PRN
  Administered 2015-02-08: 2 mg via INTRAVENOUS

## 2015-02-08 MED ORDER — LIDOCAINE-EPINEPHRINE 1 %-1:100000 IJ SOLN
INTRAMUSCULAR | Status: AC
  Filled 2015-02-08: qty 1

## 2015-02-08 MED ORDER — OXYCODONE HCL 5 MG/5ML PO SOLN: 5.0000 mg | Freq: Once | ORAL | Status: AC | PRN

## 2015-02-08 MED ORDER — DEXAMETHASONE SODIUM PHOSPHATE 4 MG/ML IJ SOLN
INTRAMUSCULAR | Status: DC | PRN
  Administered 2015-02-08: 10 mg via INTRAVENOUS

## 2015-02-08 MED ORDER — CEFAZOLIN SODIUM-DEXTROSE 2-3 GM-% IV SOLR
INTRAVENOUS | Status: AC
  Filled 2015-02-08: qty 50

## 2015-02-08 MED ORDER — HYDROCODONE-ACETAMINOPHEN 7.5-325 MG PO TABS: 1.0000 | ORAL_TABLET | Freq: Four times a day (QID) | ORAL | Status: DC | PRN

## 2015-02-08 SURGICAL SUPPLY — 55 items
ATTRACTOMAT 16X20 MAGNETIC DRP (DRAPES) IMPLANT
BENZOIN TINCTURE PRP APPL 2/3 (GAUZE/BANDAGES/DRESSINGS) IMPLANT
BLADE SURG 15 STRL LF DISP TIS (BLADE) ×1 IMPLANT
BLADE SURG 15 STRL SS (BLADE) ×2
CANISTER SUCT 1200ML W/VALVE (MISCELLANEOUS) ×3 IMPLANT
CLEANER CAUTERY TIP 5X5 PAD (MISCELLANEOUS) ×1 IMPLANT
CLIP TI MEDIUM 6 (CLIP) IMPLANT
CLIP TI WIDE RED SMALL 6 (CLIP) IMPLANT
CLOSURE WOUND 1/2 X4 (GAUZE/BANDAGES/DRESSINGS)
CORDS BIPOLAR (ELECTRODE) IMPLANT
COVER BACK TABLE 60X90IN (DRAPES) ×3 IMPLANT
COVER MAYO STAND STRL (DRAPES) ×3 IMPLANT
DERMABOND ADVANCED (GAUZE/BANDAGES/DRESSINGS)
DERMABOND ADVANCED .7 DNX12 (GAUZE/BANDAGES/DRESSINGS) IMPLANT
DRAIN JACKSON RD 7FR 3/32 (WOUND CARE) IMPLANT
DRAIN PENROSE 1/4X12 LTX STRL (WOUND CARE) IMPLANT
DRAPE U-SHAPE 76X120 STRL (DRAPES) ×3 IMPLANT
ELECT COATED BLADE 2.86 ST (ELECTRODE) ×3 IMPLANT
ELECT REM PT RETURN 9FT ADLT (ELECTROSURGICAL) ×3
ELECTRODE REM PT RTRN 9FT ADLT (ELECTROSURGICAL) ×1 IMPLANT
EVACUATOR SILICONE 100CC (DRAIN) IMPLANT
GAUZE SPONGE 4X4 16PLY XRAY LF (GAUZE/BANDAGES/DRESSINGS) IMPLANT
GLOVE BIOGEL M STRL SZ7.5 (GLOVE) ×3 IMPLANT
GLOVE BIOGEL PI IND STRL 8 (GLOVE) ×1 IMPLANT
GLOVE BIOGEL PI INDICATOR 8 (GLOVE) ×2
GLOVE ECLIPSE 7.5 STRL STRAW (GLOVE) ×3 IMPLANT
GOWN STRL REUS W/ TWL LRG LVL3 (GOWN DISPOSABLE) IMPLANT
GOWN STRL REUS W/ TWL XL LVL3 (GOWN DISPOSABLE) ×2 IMPLANT
GOWN STRL REUS W/TWL LRG LVL3 (GOWN DISPOSABLE)
GOWN STRL REUS W/TWL XL LVL3 (GOWN DISPOSABLE) ×4
NEEDLE PRECISIONGLIDE 27X1.5 (NEEDLE) IMPLANT
NS IRRIG 1000ML POUR BTL (IV SOLUTION) ×3 IMPLANT
PACK BASIN DAY SURGERY FS (CUSTOM PROCEDURE TRAY) ×3 IMPLANT
PAD CLEANER CAUTERY TIP 5X5 (MISCELLANEOUS) ×2
PENCIL FOOT CONTROL (ELECTRODE) ×3 IMPLANT
RUBBERBAND STERILE (MISCELLANEOUS) IMPLANT
SPONGE GAUZE 2X2 8PLY STER LF (GAUZE/BANDAGES/DRESSINGS)
SPONGE GAUZE 2X2 8PLY STRL LF (GAUZE/BANDAGES/DRESSINGS) IMPLANT
SPONGE GAUZE 4X4 12PLY STER LF (GAUZE/BANDAGES/DRESSINGS) IMPLANT
STRIP CLOSURE SKIN 1/2X4 (GAUZE/BANDAGES/DRESSINGS) IMPLANT
SUCTION FRAZIER TIP 10 FR DISP (SUCTIONS) IMPLANT
SUT CHROMIC 3 0 PS 2 (SUTURE) IMPLANT
SUT CHROMIC 4 0 P 3 18 (SUTURE) ×3 IMPLANT
SUT ETHILON 4 0 PS 2 18 (SUTURE) IMPLANT
SUT ETHILON 5 0 P 3 18 (SUTURE)
SUT NYLON ETHILON 5-0 P-3 1X18 (SUTURE) IMPLANT
SUT PLAIN 5 0 P 3 18 (SUTURE) IMPLANT
SUT SILK 4 0 TIES 17X18 (SUTURE) ×3 IMPLANT
SUT VICRYL 4-0 PS2 18IN ABS (SUTURE) IMPLANT
SYR BULB 3OZ (MISCELLANEOUS) ×3 IMPLANT
SYR CONTROL 10ML LL (SYRINGE) ×3 IMPLANT
TOWEL OR 17X24 6PK STRL BLUE (TOWEL DISPOSABLE) ×3 IMPLANT
TRAY DSU PREP LF (CUSTOM PROCEDURE TRAY) ×3 IMPLANT
TUBE CONNECTING 20'X1/4 (TUBING) ×1
TUBE CONNECTING 20X1/4 (TUBING) ×2 IMPLANT

## 2015-02-08 NOTE — Discharge Instructions (Signed)
It is okay to shower and use soap and water but do not use any cream, oils or ointments.    Post Anesthesia Home Care Instructions  Activity: Get plenty of rest for the remainder of the day. A responsible adult should stay with you for 24 hours following the procedure.  For the next 24 hours, DO NOT: -Drive a car -Paediatric nurse -Drink alcoholic beverages -Take any medication unless instructed by your physician -Make any legal decisions or sign important papers.  Meals: Start with liquid foods such as gelatin or soup. Progress to regular foods as tolerated. Avoid greasy, spicy, heavy foods. If nausea and/or vomiting occur, drink only clear liquids until the nausea and/or vomiting subsides. Call your physician if vomiting continues.  Special Instructions/Symptoms: Your throat may feel dry or sore from the anesthesia or the breathing tube placed in your throat during surgery. If this causes discomfort, gargle with warm salt water. The discomfort should disappear within 24 hours.  If you had a scopolamine patch placed behind your ear for the management of post- operative nausea and/or vomiting:  1. The medication in the patch is effective for 72 hours, after which it should be removed.  Wrap patch in a tissue and discard in the trash. Wash hands thoroughly with soap and water. 2. You may remove the patch earlier than 72 hours if you experience unpleasant side effects which may include dry mouth, dizziness or visual disturbances. 3. Avoid touching the patch. Wash your hands with soap and water after contact with the patch.

## 2015-02-08 NOTE — Interval H&P Note (Signed)
History and Physical Interval Note:  02/08/2015 10:04 AM  Mallory Price  has presented today for surgery, with the diagnosis of CERVICAL LYMPHADENPHAYY   The various methods of treatment have been discussed with the patient and family. After consideration of risks, benefits and other options for treatment, the patient has consented to  Procedure(s): LEFT CERVICAL LYMPH NODE BIOPSY (Left) as a surgical intervention .  The patient's history has been reviewed, patient examined, no change in status, stable for surgery.  I have reviewed the patient's chart and labs.  Questions were answered to the patient's satisfaction.     Leronda Lewers

## 2015-02-08 NOTE — Anesthesia Preprocedure Evaluation (Signed)
Anesthesia Evaluation  Patient identified by MRN, date of birth, ID band Patient awake    Reviewed: Allergy & Precautions, NPO status , Patient's Chart, lab work & pertinent test results  Airway Mallampati: I  TM Distance: >3 FB Neck ROM: Full    Dental  (+) Teeth Intact, Dental Advisory Given   Pulmonary asthma ,  breath sounds clear to auscultation        Cardiovascular Rhythm:Regular Rate:Normal     Neuro/Psych    GI/Hepatic   Endo/Other    Renal/GU      Musculoskeletal   Abdominal   Peds  Hematology   Anesthesia Other Findings   Reproductive/Obstetrics                             Anesthesia Physical Anesthesia Plan  ASA: II  Anesthesia Plan: General   Post-op Pain Management:    Induction: Intravenous  Airway Management Planned: LMA  Additional Equipment:   Intra-op Plan:   Post-operative Plan: Extubation in OR  Informed Consent: I have reviewed the patients History and Physical, chart, labs and discussed the procedure including the risks, benefits and alternatives for the proposed anesthesia with the patient or authorized representative who has indicated his/her understanding and acceptance.   Dental advisory given  Plan Discussed with: CRNA, Anesthesiologist and Surgeon  Anesthesia Plan Comments:         Anesthesia Quick Evaluation

## 2015-02-08 NOTE — Op Note (Signed)
OPERATIVE REPORT  DATE OF SURGERY: 02/08/2015  PATIENT:  Mallory Price,  24 y.o. female  PRE-OPERATIVE DIAGNOSIS:  CERVICAL LYMPHADENPHAYY   POST-OPERATIVE DIAGNOSIS:  cervical lymphadenopathy  PROCEDURE:  Procedure(s): LEFT CERVICAL LYMPH NODE BIOPSY  SURGEON:  Beckie Salts, MD  ASSISTANTS: None  ANESTHESIA:   General   EBL:  10 ml  DRAINS: None  LOCAL MEDICATIONS USED:  None  SPECIMEN:  Left supraclavicular node.  COUNTS:  Correct  PROCEDURE DETAILS: The patient was taken to the operating room and placed on the operating table in the supine position. Following induction of general endotracheal anesthesia, the left neck was prepped and draped in a standard fashion. A 3 cm incision was outlined with a marking pen just above the clavicle. Electrocautery was used to incise the skin and subcutaneous tissue. A self-retaining retractor was used throughout the case. Blunt dissection was used to carry down to the lymph node that was palpable. This was dissected free of all surrounding tissue using cautery and 4-0 silk ties as needed. No major nerves or blood vessels were identified. The lesion was removed in its entirety and sent fresh for pathologic evaluation and lymphoma workup. The wound was irrigated with saline and suctioned. Hemostasis was completed. 2 layer closure was accomplished using 3-0 chromic including a subcuticular running closure. Dermabond was used on the skin. Patient was awakened extubated and transferred to recovery in stable condition.    PATIENT DISPOSITION:  To PACU, stable

## 2015-02-08 NOTE — Transfer of Care (Signed)
Immediate Anesthesia Transfer of Care Note  Patient: Mallory Price  Procedure(s) Performed: Procedure(s): LEFT CERVICAL LYMPH NODE BIOPSY (Left)  Patient Location: PACU  Anesthesia Type:General  Level of Consciousness: awake, alert  and oriented  Airway & Oxygen Therapy: Patient Spontanous Breathing and Patient connected to face mask oxygen  Post-op Assessment: Report given to RN and Post -op Vital signs reviewed and stable  Post vital signs: Reviewed and stable  Last Vitals:  Filed Vitals:   02/08/15 1119  BP: 128/72  Pulse:   Temp:   Resp:     Complications: No apparent anesthesia complications

## 2015-02-08 NOTE — H&P (View-Only) (Signed)
  Assessment  Cervical lymphadenopathy (785.6) (R59.0). Mediastinal lymphadenopathy (785.6) (R59.0). Discussed  Lower cervical and mediastinal adenopathy. Concerning for possible lymphoma. FNA performed. We will discuss results when available. Reason For Visit  Pt here for evaluation of swollen lymph node. HPI  Previously healthy young lady, presented to emergency department couple of days ago with right-sided lower chest pain. During evaluation for possible pulmonary embolus, she was found to have mediastinal adenopathy and a left supraclavicular adenopathy. She hasn't had any recent constitutional symptoms. She does have cats. Allergies  No Known Drug Allergies. Current Meds  NuvaRing RING;; RPT. Personal Hx  Caffeine use (V49.89) (F15.90) Never a smoker Social alcohol use (Z78.9). ROS  12 system ROS was obtained and reviewed on the Health Maintenance form dated today.  Positive responses are shown above.  If the symptom is not checked, the patient has denied it. Vital Signs   Recorded by Sallyanne Kuster on 01 Feb 2015 09:32 AM BP:102/72,  HR: 79 b/min,  Height: 5 ft 5 in, Weight: 126 lb , BMI: 21 kg/m2,  BMI Calculated: 20.97 ,  BSA Calculated: 1.63. Physical Exam  APPEARANCE: Well developed, well nourished, in no acute distress.  Normal affect, in a pleasant mood.  Oriented to time, place and person. COMMUNICATION: Normal voice   HEAD & FACE:  No scars, lesions or masses of head and face.  Sinuses nontender to palpation.  Salivary glands without mass or tenderness.  Facial strength symmetric.  No facial lesion, scars, or mass. EYES: EOMI with normal primary gaze alignment. Visual acuity grossly intact.  PERRLA EXTERNAL EAR & NOSE: No scars, lesions or masses  EAC & TYMPANIC MEMBRANE:  EAC shows no obstructing lesions or debris and tympanic membranes are normal bilaterally with good movement to insufflation. GROSS HEARING: Normal  TMJ:  Nontender  INTRANASAL EXAM: No polyps or  purulence.  NASOPHARYNX: Normal, without lesions. LIPS, TEETH & GUMS: No lip lesions, normal dentition and normal gums. ORAL CAVITY/OROPHARYNX:  Oral mucosa moist without lesion or asymmetry of the palate, tongue, tonsil or posterior pharynx. LARYNX (mirror exam):  No lesions of the epiglottis, false cord or TVC's and cords move well to phonation. HYPOPHARYNX (mirror exam): No lesions, asymmetry or pooling of secretions. NECK:  Supple with normal range of motion. THYROID:  Normal with no masses palpable.  NEUROLOGIC:  No gross CN deficits. No nystagmus noted.   LYMPHATIC: Firm and slightly tender left supraclavicular adenopathy palpable. There are no palpable axillary nodes on either side. There is no palpable abdominal mass although she is tender in the mid epigastrium area. There is shotty slightly tender bilateral inguinal adenopathy. Procedure  FNA The risks and benefits of this procedure have been thoroughly discussed with the patient.  The most commons risks outlined included but were not limited to: injury  to the nasal mucosa or throat irritation.  The patient was further informed that there are other less common risks.  The patient was given the opportunity to ask questions and all such questions were answered to the patient's satisfaction.  Patient acknowledged the risks and has agreed to proceed.   Preop Diagnosis: [Supraclavicular adenopathy  ] Procedure: Using a 10 cc syringe with a 22 gauge needle, the mass aspirated. This was repeated with a separate needle a second time. Cytology samples were prepared. A dressing was applied. Tolerance: Excellent Unplanned interventions: None  Unplanned events: No complications. Signature  Electronically signed by : Izora Gala  M.D.; 02/01/2015 10:36 AM EST.

## 2015-02-08 NOTE — Anesthesia Postprocedure Evaluation (Signed)
  Anesthesia Post-op Note  Patient: Mallory Price  Procedure(s) Performed: Procedure(s): LEFT CERVICAL LYMPH NODE BIOPSY (Left)  Patient Location: PACU  Anesthesia Type:General  Level of Consciousness: awake and alert   Airway and Oxygen Therapy: Patient Spontanous Breathing  Post-op Pain: mild  Post-op Assessment: Post-op Vital signs reviewed, Patient's Cardiovascular Status Stable and Respiratory Function Stable  Post-op Vital Signs: Reviewed  Filed Vitals:   02/08/15 1306  BP: 113/78  Pulse: 90  Temp: 36.8 C  Resp:     Complications: No apparent anesthesia complications

## 2015-02-08 NOTE — Anesthesia Procedure Notes (Signed)
Procedure Name: LMA Insertion Date/Time: 02/08/2015 10:23 AM Performed by: Lieutenant Diego Pre-anesthesia Checklist: Patient identified, Emergency Drugs available, Suction available and Patient being monitored Patient Re-evaluated:Patient Re-evaluated prior to inductionOxygen Delivery Method: Circle System Utilized Preoxygenation: Pre-oxygenation with 100% oxygen Intubation Type: IV induction Ventilation: Mask ventilation without difficulty LMA: LMA inserted LMA Size: 4.0 Number of attempts: 1 Airway Equipment and Method: Bite block Placement Confirmation: positive ETCO2 and breath sounds checked- equal and bilateral Tube secured with: Tape Dental Injury: Teeth and Oropharynx as per pre-operative assessment

## 2015-02-11 ENCOUNTER — Encounter (HOSPITAL_BASED_OUTPATIENT_CLINIC_OR_DEPARTMENT_OTHER): Payer: Self-pay | Admitting: Otolaryngology

## 2015-02-20 ENCOUNTER — Telehealth: Payer: Self-pay | Admitting: Hematology and Oncology

## 2015-02-20 ENCOUNTER — Encounter: Payer: Self-pay | Admitting: Hematology and Oncology

## 2015-02-20 ENCOUNTER — Ambulatory Visit (HOSPITAL_BASED_OUTPATIENT_CLINIC_OR_DEPARTMENT_OTHER): Payer: BLUE CROSS/BLUE SHIELD | Admitting: Hematology and Oncology

## 2015-02-20 ENCOUNTER — Ambulatory Visit: Payer: BLUE CROSS/BLUE SHIELD

## 2015-02-20 VITALS — BP 117/78 | HR 82 | Temp 98.2°F | Resp 22 | Ht 60.0 in | Wt 131.1 lb

## 2015-02-20 DIAGNOSIS — R1111 Vomiting without nausea: Secondary | ICD-10-CM | POA: Diagnosis not present

## 2015-02-20 DIAGNOSIS — C8190 Hodgkin lymphoma, unspecified, unspecified site: Secondary | ICD-10-CM

## 2015-02-20 DIAGNOSIS — Z8571 Personal history of Hodgkin lymphoma: Secondary | ICD-10-CM | POA: Insufficient documentation

## 2015-02-20 DIAGNOSIS — R110 Nausea: Secondary | ICD-10-CM

## 2015-02-20 HISTORY — DX: Nausea: R11.0

## 2015-02-20 HISTORY — DX: Hodgkin lymphoma, unspecified, unspecified site: C81.90

## 2015-02-20 MED ORDER — PROMETHAZINE HCL 25 MG PO TABS: 25.0000 mg | ORAL_TABLET | Freq: Four times a day (QID) | ORAL | Status: DC | PRN

## 2015-02-20 NOTE — Assessment & Plan Note (Signed)
The cause is unknown. I will give her prescription anti-medics to use as needed.

## 2015-02-20 NOTE — Progress Notes (Signed)
[facility] CONSULT NOTE  Patient Care Team: No Pcp Per Patient as PCP - General (General Practice)  CHIEF COMPLAINTS/PURPOSE OF CONSULTATION:  Newly diagnosed Hodgkin lymphoma  HISTORY OF PRESENTING ILLNESS:  Mallory Price 24 y.o. female is here because of the lid and O's Hoskins lymphoma. She has been complaining of palpable supraclavicular lymphadenopathy on the left neck for the past 4 months which is getting progressively larger. She also complained of pleuritic chest pain. She was evaluated in the emergency department with CT scan which showed significant lymphadenopathy, suspicious for malignancy. She was referred to ENT who performed lymph node excisional biopsy and was diagnosed with Hodgkin lymphoma. She complains of anorexia and weight loss. She have occasional skin itching. She have daily non-drenching night sweats for the past 1 month. She have occasional nausea but no vomiting. She has occasional back pain of unknown etiology and fatigue.  MEDICAL HISTORY:  Past Medical History  Diagnosis Date  . Asthma   . Anxiety     h/o some anxiety, resolved with therapy, no meds  . Cancer 02/03/15    lymphoma  . Nausea without vomiting 02/20/2015  . Hodgkin lymphoma 02/20/2015    SURGICAL HISTORY: Past Surgical History  Procedure Laterality Date  . Wisdom tooth extraction    . Lymph node biopsy Left 02/08/2015    Procedure: LEFT CERVICAL LYMPH NODE BIOPSY;  Surgeon: Izora Gala, MD;  Location: [facility];  Service: ENT;  Laterality: Left;    SOCIAL HISTORY: History   Social History  . Marital Status: Single    Spouse Name: N/A  . Number of Children: N/A  . Years of Education: N/A   Occupational History  . Not on file.   Social History Main Topics  . Smoking status: Never Smoker   . Smokeless tobacco: Never Used  . Alcohol Use: No  . Drug Use: No  . Sexual Activity: Yes    Birth Control/ Protection: Other-see comments   Comment: nuvaring   Other Topics Concern  . Not on file   Social History Narrative    FAMILY HISTORY: History reviewed. No pertinent family history.  ALLERGIES:  has No Known Allergies.  MEDICATIONS:  Current Outpatient Prescriptions  Medication Sig Dispense Refill  . etonogestrel-ethinyl estradiol (NUVARING) 0.12-0.015 MG/24HR vaginal ring Place 1 each vaginally every 28 (twenty-eight) days. Insert vaginally and leave in place for 3 consecutive weeks, then remove for 1 week.    Marland Kitchen HYDROcodone-acetaminophen (NORCO) 7.5-325 MG per tablet Take 1 tablet by mouth every 6 (six) hours as needed for moderate pain. 30 tablet 0  . promethazine (PHENERGAN) 25 MG tablet Take 1 tablet (25 mg total) by mouth every 6 (six) hours as needed for nausea or vomiting. 30 tablet 0   No current facility-administered medications for this visit.    REVIEW OF SYSTEMS:   Constitutional: Denies fevers, chills or abnormal night sweats Eyes: Denies blurriness of vision, double vision or watery eyes Ears, nose, mouth, throat, and face: Denies mucositis or sore throat Cardiovascular: Denies palpitation, chest discomfort or lower extremity swelling Gastrointestinal:  Denies nausea, heartburn or change in bowel habits Skin: Denies abnormal skin rashes Neurological:Denies numbness, tingling or new weaknesses Behavioral/Psych: Mood is stable, no new changes  All other systems were reviewed with the patient and are negative.  PHYSICAL EXAMINATION: ECOG PERFORMANCE STATUS: 1 - Symptomatic but completely ambulatory  Filed Vitals:   02/20/15 1432  BP: 117/78  Pulse: 82  Temp: 98.2 F (36.8 C)  Resp: 22   Filed Weights   02/20/15 1432  Weight: 131 lb 1.6 oz (59.467 kg)    GENERAL:alert, no distress and comfortable SKIN: skin color, texture, turgor are normal, no rashes or significant lesions EYES: normal, conjunctiva are pink and non-injected, sclera clear OROPHARYNX:no exudate, no erythema and lips,  buccal mucosa, and tongue normal  NECK: supple, thyroid normal size, non-tender, without nodularity LYMPH:  Palpable left supraclavicular lymphadenopathy. LUNGS: clear to auscultation and percussion with normal breathing effort HEART: regular rate & rhythm and no murmurs and no lower extremity edema ABDOMEN:abdomen soft, non-tender and normal bowel sounds Musculoskeletal:no cyanosis of digits and no clubbing  PSYCH: alert & oriented x 3 with fluent speech NEURO: no focal motor/sensory deficits  LABORATORY DATA:  I have reviewed the data as listed Lab Results  Component Value Date   WBC 8.8 01/30/2015   HGB 14.3 02/08/2015   HCT 36.9 01/30/2015   MCV 91.8 01/30/2015   PLT 234 01/30/2015    Recent Labs  01/30/15 2131  NA 139  K 3.3*  CL 106  CO2 22  GLUCOSE 110*  BUN 6  CREATININE 0.63  CALCIUM 8.8*  GFRNONAA >60  GFRAA >60    RADIOGRAPHIC STUDIES: I have personally reviewed the radiological images as listed and agreed with the findings in the report. Ct Angio Chest Pe W/cm &/or Wo Cm  01/31/2015   CLINICAL DATA:  Abdominal pain under the rib radiating to the top of the right shoulder and to the back. Shortness of breath, dizziness, and lightheadedness. Elevated D-dimer.  EXAM: CT ANGIOGRAPHY CHEST WITH CONTRAST  TECHNIQUE: Multidetector CT imaging of the chest was performed using the standard protocol during bolus administration of intravenous contrast. Multiplanar CT image reconstructions and MIPs were obtained to evaluate the vascular anatomy.  CONTRAST:  155m OMNIPAQUE IOHEXOL 350 MG/ML SOLN  COMPARISON:  None.  FINDINGS: Technically adequate study with good opacification of the central and segmental pulmonary arteries. No focal filling defects demonstrated. No evidence of significant pulmonary embolus.  Normal heart size. Normal caliber thoracic aorta. No aortic dissection. Great vessel origins are patent. Esophagus is decompressed. Anterior mediastinal mass measuring 6.2  by 3.6 cm. Right paratracheal and pretracheal lymphadenopathy. Mild subcarinal lymphadenopathy. Right pretracheal lymphadenopathy measures 2.1 cm short axis dimension. Bilateral supraclavicular lymphadenopathy, greatest on the left with measurement of 2.9 cm short axis dimension. No significant axillary or hilar lymphadenopathy. Appearance is worrisome for lymphoma.  Mild dependent changes in the lung bases. No focal airspace disease or consolidation. Airways are patent.  Included portions of the upper abdominal organs are grossly unremarkable. Mild anterior compression and circumscribed central defect in a lower thoracic vertebra, probably T10. This may represent involvement of the vertebra with lymphoma.  Review of the MIP images confirms the above findings.  IMPRESSION: No evidence of significant pulmonary embolus. Prominent mediastinal and supraclavicular lymphadenopathy worrisome for lymphoma. Anterior compression and central defect in the body of T10 may indicate involvement of vertebra. No evidence of active pulmonary disease.   Electronically Signed   By: WLucienne CapersM.D.   On: 01/31/2015 01:06    ASSESSMENT & PLAN:  Hodgkin lymphoma I discussed with the patient her diagnosis, treatment options and importance of staging. Due to the significant disease burden, she is a high-risk patient and I need to get her started on treatment as soon as possible. I will order staging PET scan, bone marrow biopsy, echocardiogram, pulmonary function tests, port placement, chemotherapy education class, blood work and plan  to start her on treatment by 03/01/2015. Due to her young age, we discuss fertility preservation options. She elected not to be referred to fertility clinic and would be content with just using Lupron to suppress menstruation in the future. I will see her back next week to review test results and chemotherapy consents.   Nausea without vomiting The cause is unknown. I will give her  prescription anti-medics to use as needed.      All questions were answered. The patient knows to call the clinic with any problems, questions or concerns. I spent 40 minutes counseling the patient face to face. The total time spent in the appointment was 60 minutes and more than 50% was on counseling.     [facility], [place], MD 02/20/2015 3:28 PM

## 2015-02-20 NOTE — Telephone Encounter (Signed)
new patient referral-s/w patient and gave np appt for 06/01 @ 2 w/Dr. Alvy Bimler

## 2015-02-20 NOTE — Progress Notes (Signed)
Checked in new pt with no financial concerns. °

## 2015-02-20 NOTE — Assessment & Plan Note (Signed)
I discussed with the patient her diagnosis, treatment options and importance of staging. Due to the significant disease burden, she is a high-risk patient and I need to get her started on treatment as soon as possible. I will order staging PET scan, bone marrow biopsy, echocardiogram, pulmonary function tests, port placement, chemotherapy education class, blood work and plan to start her on treatment by 03/01/2015. Due to her young age, we discuss fertility preservation options. She elected not to be referred to fertility clinic and would be content with just using Lupron to suppress menstruation in the future. I will see her back next week to review test results and chemotherapy consents.

## 2015-02-20 NOTE — Telephone Encounter (Signed)
Gave and printed appt sched and avs for pt for June and July....lvm for IR and PFT to sched appt.Marland KitchenMarland KitchenI sent msg to [facility] for echo.

## 2015-02-21 ENCOUNTER — Telehealth: Payer: Self-pay | Admitting: Hematology and Oncology

## 2015-02-21 ENCOUNTER — Telehealth: Payer: Self-pay | Admitting: *Deleted

## 2015-02-21 NOTE — Telephone Encounter (Signed)
VM message from [facility] @ [place] requesting prior auth for pt's cardiac echo scheduled for 02/22/15. Please call her @ [phone]

## 2015-02-21 NOTE — Telephone Encounter (Signed)
lvm for pt regarding to added echo and pft.Marland KitchenMarland KitchenMarland Kitchen

## 2015-02-22 ENCOUNTER — Telehealth: Payer: Self-pay | Admitting: Hematology and Oncology

## 2015-02-22 ENCOUNTER — Other Ambulatory Visit: Payer: Self-pay | Admitting: Radiology

## 2015-02-22 ENCOUNTER — Ambulatory Visit (HOSPITAL_COMMUNITY)
Admission: RE | Admit: 2015-02-22 | Discharge: 2015-02-22 | Disposition: A | Discharge status: Home / Self Care | Payer: BLUE CROSS/BLUE SHIELD | Source: Ambulatory Visit | Attending: Hematology and Oncology | Admitting: Hematology and Oncology

## 2015-02-22 DIAGNOSIS — Z01810 Encounter for preprocedural cardiovascular examination: Secondary | ICD-10-CM | POA: Diagnosis not present

## 2015-02-22 DIAGNOSIS — C8190 Hodgkin lymphoma, unspecified, unspecified site: Secondary | ICD-10-CM | POA: Insufficient documentation

## 2015-02-22 NOTE — Progress Notes (Signed)
  Echocardiogram 2D Echocardiogram has been performed.  Mallory Price 02/22/2015, 10:10 AM

## 2015-02-22 NOTE — Telephone Encounter (Signed)
Gave and printed pt new sched with all June appts

## 2015-02-25 ENCOUNTER — Encounter (HOSPITAL_COMMUNITY): Payer: Self-pay

## 2015-02-25 ENCOUNTER — Ambulatory Visit (HOSPITAL_COMMUNITY)
Admission: RE | Admit: 2015-02-25 | Discharge: 2015-02-25 | Disposition: A | Discharge status: Home / Self Care | Payer: BLUE CROSS/BLUE SHIELD | Source: Ambulatory Visit | Attending: Hematology and Oncology | Admitting: Hematology and Oncology

## 2015-02-25 ENCOUNTER — Other Ambulatory Visit: Payer: Self-pay | Admitting: Hematology and Oncology

## 2015-02-25 DIAGNOSIS — C8190 Hodgkin lymphoma, unspecified, unspecified site: Secondary | ICD-10-CM

## 2015-02-25 DIAGNOSIS — F419 Anxiety disorder, unspecified: Secondary | ICD-10-CM | POA: Diagnosis not present

## 2015-02-25 DIAGNOSIS — J45909 Unspecified asthma, uncomplicated: Secondary | ICD-10-CM | POA: Diagnosis not present

## 2015-02-25 LAB — BASIC METABOLIC PANEL
Anion gap: 10 (ref 5–15)
BUN: 8 mg/dL (ref 6–20)
CALCIUM: 8.8 mg/dL — AB (ref 8.9–10.3)
CHLORIDE: 109 mmol/L (ref 101–111)
CO2: 21 mmol/L — ABNORMAL LOW (ref 22–32)
Creatinine, Ser: 0.51 mg/dL (ref 0.44–1.00)
GFR calc non Af Amer: >60 mL/min (ref >60)
Glucose, Bld: 89 mg/dL (ref 65–99)
POTASSIUM: 3.6 mmol/L (ref 3.5–5.1)
Sodium: 140 mmol/L (ref 135–145)

## 2015-02-25 LAB — APTT: aPTT: 31 seconds (ref 24–37)

## 2015-02-25 LAB — CBC
HEMATOCRIT: 37.4 % (ref 36.0–46.0)
HEMOGLOBIN: 12.8 g/dL (ref 12.0–15.0)
MCH: 31 pg (ref 26.0–34.0)
MCHC: 34.2 g/dL (ref 30.0–36.0)
MCV: 90.6 fL (ref 78.0–100.0)
PLATELETS: 325 10*3/uL (ref 150–400)
RBC: 4.13 MIL/uL (ref 3.87–5.11)
RDW: 12.5 % (ref 11.5–15.5)
WBC: 9.5 10*3/uL (ref 4.0–10.5)

## 2015-02-25 LAB — PROTIME-INR
INR: 0.93 (ref 0.00–1.49)
PROTHROMBIN TIME: 12.7 s (ref 11.6–15.2)

## 2015-02-25 MED ORDER — LIDOCAINE-EPINEPHRINE 2 %-1:100000 IJ SOLN
INTRAMUSCULAR | Status: AC
  Filled 2015-02-25: qty 1

## 2015-02-25 MED ORDER — FENTANYL CITRATE (PF) 100 MCG/2ML IJ SOLN
INTRAMUSCULAR | Status: AC | PRN
Start: 2015-02-25 — End: 2015-02-25
  Administered 2015-02-25 (×3): 50 ug via INTRAVENOUS

## 2015-02-25 MED ORDER — CEFAZOLIN SODIUM-DEXTROSE 2-3 GM-% IV SOLR
2.0000 g | Freq: Once | INTRAVENOUS | Status: AC
  Administered 2015-02-25: 2 g via INTRAVENOUS

## 2015-02-25 MED ORDER — FENTANYL CITRATE (PF) 100 MCG/2ML IJ SOLN
INTRAMUSCULAR | Status: AC
  Filled 2015-02-25: qty 4

## 2015-02-25 MED ORDER — MIDAZOLAM HCL 2 MG/2ML IJ SOLN
INTRAMUSCULAR | Status: AC | PRN
  Administered 2015-02-25 (×6): 1 mg via INTRAVENOUS

## 2015-02-25 MED ORDER — MIDAZOLAM HCL 2 MG/2ML IJ SOLN
INTRAMUSCULAR | Status: AC
Start: 2015-02-25 — End: 2015-02-25
  Filled 2015-02-25: qty 6

## 2015-02-25 MED ORDER — CEFAZOLIN SODIUM-DEXTROSE 2-3 GM-% IV SOLR
INTRAVENOUS | Status: AC
Start: 2015-02-25 — End: 2015-02-25
  Filled 2015-02-25: qty 50

## 2015-02-25 MED ORDER — SODIUM CHLORIDE 0.9 % IV SOLN
Freq: Once | INTRAVENOUS | Status: AC
  Administered 2015-02-25: 13:00:00 via INTRAVENOUS

## 2015-02-25 MED ORDER — HEPARIN SOD (PORK) LOCK FLUSH 100 UNIT/ML IV SOLN
INTRAVENOUS | Status: AC
  Filled 2015-02-25: qty 5

## 2015-02-25 MED ORDER — HEPARIN SOD (PORK) LOCK FLUSH 100 UNIT/ML IV SOLN
INTRAVENOUS | Status: AC | PRN
  Administered 2015-02-25: 500 [IU]

## 2015-02-25 NOTE — H&P (Signed)
Chief Complaint: "I'm here to get a port a cath"  Referring Physician(s): Gorsuch,Ni  History of Present Illness: Mallory Price is a 24 y.o. female with history of recently diagnosed Hodgkin's lymphoma who presents today for port a cath placement for chemotherapy.   Past Medical History  Diagnosis Date  . Asthma   . Anxiety     h/o some anxiety, resolved with therapy, no meds  . Cancer 02/03/15    lymphoma  . Nausea without vomiting 02/20/2015  . Hodgkin lymphoma 02/20/2015    Past Surgical History  Procedure Laterality Date  . Wisdom tooth extraction    . Lymph node biopsy Left 02/08/2015    Procedure: LEFT CERVICAL LYMPH NODE BIOPSY;  Surgeon: Izora Gala, MD;  Location: [facility];  Service: ENT;  Laterality: Left;    Allergies: Review of patient's allergies indicates no known allergies.  Medications: Prior to Admission medications   Medication Sig Start Date End Date Taking? Authorizing Provider  etonogestrel-ethinyl estradiol (NUVARING) 0.12-0.015 MG/24HR vaginal ring Place 1 each vaginally every 28 (twenty-eight) days. Insert vaginally and leave in place for 3 consecutive weeks, then remove for 1 week.   Yes Historical Provider, MD  Fiber POWD Take 4 g by mouth 2 (two) times daily.   Yes Historical Provider, MD  HYDROcodone-acetaminophen (NORCO) 7.5-325 MG per tablet Take 1 tablet by mouth every 6 (six) hours as needed for moderate pain. 02/08/15  Yes Izora Gala, MD  NON FORMULARY Take 1 tablet by mouth 2 (two) times daily. Balance health omega   Yes Historical Provider, MD  NON FORMULARY Take 3 tablets by mouth 2 (two) times daily. Nutralite doubleX multivitamin pack   Yes Historical Provider, MD  NON FORMULARY Take 1 tablet by mouth 2 (two) times daily. Garlic-600mg , peppermint leaf- 30mg , alliin-13.5mg  allicin-6mg  = garlic heart care tab   Yes Historical Provider, MD  NON FORMULARY Take 1 tablet by mouth 2 (two) times daily. Joint health + vit C    Yes Historical Provider, MD  promethazine (PHENERGAN) 25 MG tablet Take 1 tablet (25 mg total) by mouth every 6 (six) hours as needed for nausea or vomiting. 02/20/15  Yes 19/1/16, MD    No family history on file.  History   Social History  . Marital Status: Single    Spouse Name: N/A  . Number of Children: N/A  . Years of Education: N/A   Social History Main Topics  . Smoking status: Never Smoker   . Smokeless tobacco: Never Used  . Alcohol Use: No  . Drug Use: No  . Sexual Activity: Yes    Birth Control/ Protection: Other-see comments     Comment: nuvaring   Other Topics Concern  . Not on file   Social History Narrative      Review of Systems  Constitutional: Positive for chills and fatigue. Negative for fever.       Night sweats  Respiratory: Positive for cough. Negative for shortness of breath.   Cardiovascular:       Some rt pleuritic chest pain  Gastrointestinal: Negative for vomiting, abdominal pain and blood in stool.       Occ nausea  Genitourinary: Negative for dysuria and hematuria.  Musculoskeletal: Negative for back pain.  Neurological: Positive for headaches.  Hematological: Does not bruise/bleed easily.  Psychiatric/Behavioral: The patient is nervous/anxious.     Vital Signs: BP 120/56 mmHg  Pulse 72  Temp(Src) 98 F (36.7 C) (Oral)  Resp 20  SpO2 100%  LMP 01/17/2015  Physical Exam  Constitutional: She is oriented to person, place, and time. She appears well-developed and well-nourished.  Neck:  Recent left cervical LN bx with tenderness/ some edema at site  Pulmonary/Chest: Effort normal and breath sounds normal.  Abdominal: Soft. Bowel sounds are normal. There is no tenderness.  Musculoskeletal: Normal range of motion. She exhibits no edema.  Lymphadenopathy:    She has cervical adenopathy.  Neurological: She is alert and oriented to person, place, and time.    Imaging: Ct Angio Chest Pe W/cm &/or Wo Cm  01/31/2015   CLINICAL  DATA:  Abdominal pain under the rib radiating to the top of the right shoulder and to the back. Shortness of breath, dizziness, and lightheadedness. Elevated D-dimer.  EXAM: CT ANGIOGRAPHY CHEST WITH CONTRAST  TECHNIQUE: Multidetector CT imaging of the chest was performed using the standard protocol during bolus administration of intravenous contrast. Multiplanar CT image reconstructions and MIPs were obtained to evaluate the vascular anatomy.  CONTRAST:  170mL OMNIPAQUE IOHEXOL 350 MG/ML SOLN  COMPARISON:  None.  FINDINGS: Technically adequate study with good opacification of the central and segmental pulmonary arteries. No focal filling defects demonstrated. No evidence of significant pulmonary embolus.  Normal heart size. Normal caliber thoracic aorta. No aortic dissection. Great vessel origins are patent. Esophagus is decompressed. Anterior mediastinal mass measuring 6.2 by 3.6 cm. Right paratracheal and pretracheal lymphadenopathy. Mild subcarinal lymphadenopathy. Right pretracheal lymphadenopathy measures 2.1 cm short axis dimension. Bilateral supraclavicular lymphadenopathy, greatest on the left with measurement of 2.9 cm short axis dimension. No significant axillary or hilar lymphadenopathy. Appearance is worrisome for lymphoma.  Mild dependent changes in the lung bases. No focal airspace disease or consolidation. Airways are patent.  Included portions of the upper abdominal organs are grossly unremarkable. Mild anterior compression and circumscribed central defect in a lower thoracic vertebra, probably T10. This may represent involvement of the vertebra with lymphoma.  Review of the MIP images confirms the above findings.  IMPRESSION: No evidence of significant pulmonary embolus. Prominent mediastinal and supraclavicular lymphadenopathy worrisome for lymphoma. Anterior compression and central defect in the body of T10 may indicate involvement of vertebra. No evidence of active pulmonary disease.    Electronically Signed   By: Lucienne Capers M.D.   On: 01/31/2015 01:06    Labs:  CBC:  Recent Labs  01/30/15 2131 02/08/15 0923  WBC 8.8  --   HGB 12.5 14.3  HCT 36.9  --   PLT 234  --     COAGS: No results for input(s): INR, APTT in the last 8760 hours.  BMP:  Recent Labs  01/30/15 2131  NA 139  K 3.3*  CL 106  CO2 22  GLUCOSE 110*  BUN 6  CALCIUM 8.8*  CREATININE 0.63  GFRNONAA >60  GFRAA >60    LIVER FUNCTION TESTS: No results for input(s): BILITOT, AST, ALT, ALKPHOS, PROT, ALBUMIN in the last 8760 hours.  TUMOR MARKERS: No results for input(s): AFPTM, CEA, CA199, CHROMGRNA in the last 8760 hours.  Assessment and Plan: Mallory Price is a 24 y.o. female with history of recently diagnosed Hodgkin's lymphoma who presents today for port a cath placement for chemotherapy.Risks and benefits discussed with the patient including, but not limited to bleeding, infection, pneumothorax, or fibrin sheath development and need for additional procedures. All of the patient's questions were answered, patient is agreeable to proceed. Consent signed and in chart.     Thank you for this interesting  consult.  I greatly enjoyed meeting Mallory Price and look forward to participating in their care.  Signed: D. Rowe Robert 02/25/2015, 12:12 PM   I spent a total of 20 minutes face to face in clinical consultation, greater than 50% of which was counseling/coordinating care for port a cath placement

## 2015-02-25 NOTE — Discharge Instructions (Signed)
Conscious Sedation, Adult, Care After Refer to this sheet in the next few weeks. These instructions provide you with information on caring for yourself after your procedure. Your health care provider may also give you more specific instructions. Your treatment has been planned according to current medical practices, but problems sometimes occur. Call your health care provider if you have any problems or questions after your procedure. WHAT TO EXPECT AFTER THE PROCEDURE  After your procedure:  You may feel sleepy, clumsy, and have poor balance for several hours.  Vomiting may occur if you eat too soon after the procedure. HOME CARE INSTRUCTIONS  Do not participate in any activities where you could become injured for at least 24 hours. Do not:  Drive.  Swim.  Ride a bicycle.  Operate heavy machinery.  Cook.  Use power tools.  Climb ladders.  Work from a high place.  Do not make important decisions or sign legal documents until you are improved.  If you vomit, drink water, juice, or soup when you can drink without vomiting. Make sure you have little or no nausea before eating solid foods.  Only take over-the-counter or prescription medicines for pain, discomfort, or fever as directed by your health care provider.  Make sure you and your family fully understand everything about the medicines given to you, including what side effects may occur.  You should not drink alcohol, take sleeping pills, or take medicines that cause drowsiness for at least 24 hours.  If you smoke, do not smoke without supervision.  If you are feeling better, you may resume normal activities 24 hours after you were sedated.  Keep all appointments with your health care provider. SEEK MEDICAL CARE IF:  Your skin is pale or bluish in color.  You continue to feel nauseous or vomit.  Your pain is getting worse and is not helped by medicine.  You have bleeding or swelling.  You are still sleepy or  feeling clumsy after 24 hours. SEEK IMMEDIATE MEDICAL CARE IF:  You develop a rash.  You have difficulty breathing.  You develop any type of allergic problem.  You have a fever. MAKE SURE YOU:  Understand these instructions.  Will watch your condition.  Will get help right away if you are not doing well or get worse. Document Released: 06/28/2013 Document Reviewed: 06/28/2013 [facility] Patient Information 2015 [place], [place]. This information is not intended to replace advice given to you by your health care provider. Make sure you discuss any questions you have with your health care provider. Implanted Port Insertion, Care After Refer to this sheet in the next few weeks. These instructions provide you with information on caring for yourself after your procedure. Your health care provider may also give you more specific instructions. Your treatment has been planned according to current medical practices, but problems sometimes occur. Call your health care provider if you have any problems or questions after your procedure. WHAT TO EXPECT AFTER THE PROCEDURE After your procedure, it is typical to have the following:   Discomfort at the port insertion site. Ice packs to the area will help.  Bruising on the skin over the port. This will subside in 3-4 days. HOME CARE INSTRUCTIONS  After your port is placed, you will get a manufacturer's information card. The card has information about your port. Keep this card with you at all times.   Know what kind of port you have. There are many types of ports available.   Wear a medical alert  bracelet in case of an emergency. This can help alert health care workers that you have a port.   The port can stay in for as long as your health care provider believes it is necessary.   A home health care nurse may give medicines and take care of the port.   You or a family member can get special training and directions for giving medicine and  taking care of the port at home.  SEEK MEDICAL CARE IF:   Your port does not flush or you are unable to get a blood return.   You have a fever or chills. SEEK IMMEDIATE MEDICAL CARE IF:  You have new fluid or pus coming from your incision.   You notice a bad smell coming from your incision site.   You have swelling, pain, or more redness at the incision or port site.   You have chest pain or shortness of breath. Document Released: 06/28/2013 Document Revised: 09/12/2013 Document Reviewed: 06/28/2013 [facility] Patient Information 2015 [place], [place]. This information is not intended to replace advice given to you by your health care provider. Make sure you discuss any questions you have with your health care provider.  May remove dressing and shower 24 to 48 hours post procedure. Keep site clean and dry.

## 2015-02-25 NOTE — Procedures (Signed)
Successful placement of right IJ approach port-a-cath with tip at the superior caval atrial junction. The catheter is ready for immediate use. No immediate post procedural complications. 

## 2015-02-26 ENCOUNTER — Ambulatory Visit (HOSPITAL_COMMUNITY)
Admission: RE | Admit: 2015-02-26 | Discharge: 2015-02-26 | Disposition: A | Discharge status: Home / Self Care | Payer: BLUE CROSS/BLUE SHIELD | Source: Ambulatory Visit | Attending: Hematology and Oncology | Admitting: Hematology and Oncology

## 2015-02-26 ENCOUNTER — Other Ambulatory Visit (HOSPITAL_COMMUNITY): Payer: Self-pay | Admitting: Respiratory Therapy

## 2015-02-26 DIAGNOSIS — F419 Anxiety disorder, unspecified: Secondary | ICD-10-CM | POA: Diagnosis not present

## 2015-02-26 DIAGNOSIS — C8190 Hodgkin lymphoma, unspecified, unspecified site: Secondary | ICD-10-CM

## 2015-02-26 DIAGNOSIS — J45909 Unspecified asthma, uncomplicated: Secondary | ICD-10-CM | POA: Diagnosis not present

## 2015-02-26 LAB — PULMONARY FUNCTION TEST
DL/VA % pred: 123 %
DL/VA: 5.33 ml/min/mmHg/L
DLCO cor % pred: 131 %
DLCO cor: 25.66 ml/min/mmHg
DLCO unc % pred: 128 %
DLCO unc: 25.18 ml/min/mmHg
FEF 25-75 POST: 3.73 L/s
FEF 25-75 Pre: 3.38 L/sec
FEF2575-%Change-Post: 10 %
FEF2575-%PRED-POST: 106 %
FEF2575-%Pred-Pre: 96 %
FEV1-%Change-Post: 4 %
FEV1-%PRED-POST: 111 %
FEV1-%Pred-Pre: 106 %
FEV1-Post: 3.32 L
FEV1-Pre: 3.16 L
FEV1FVC-%Change-Post: 0 %
FEV1FVC-%Pred-Pre: 99 %
FEV6-%Change-Post: 5 %
FEV6-%PRED-PRE: 108 %
FEV6-%Pred-Post: 114 %
FEV6-POST: 3.87 L
FEV6-Pre: 3.68 L
FEV6FVC-%PRED-POST: 100 %
FEV6FVC-%Pred-Pre: 100 %
FVC-%Change-Post: 5 %
FVC-%Pred-Post: 114 %
FVC-%Pred-Pre: 109 %
FVC-POST: 3.87 L
FVC-PRE: 3.68 L
POST FEV6/FVC RATIO: 100 %
PRE FEV1/FVC RATIO: 86 %
Post FEV1/FVC ratio: 86 %
Pre FEV6/FVC Ratio: 100 %
RV % pred: 112 %
RV: 1.17 L
TLC % pred: 108 %
TLC: 4.91 L

## 2015-02-26 LAB — GLUCOSE, CAPILLARY: Glucose-Capillary: 90 mg/dL (ref 65–99)

## 2015-02-26 MED ORDER — ALBUTEROL SULFATE (2.5 MG/3ML) 0.083% IN NEBU
2.5000 mg | INHALATION_SOLUTION | Freq: Once | RESPIRATORY_TRACT | Status: AC
  Administered 2015-02-26: 2.5 mg via RESPIRATORY_TRACT

## 2015-02-26 MED ORDER — FLUDEOXYGLUCOSE F - 18 (FDG) INJECTION
6.5100 | Freq: Once | INTRAVENOUS | Status: AC | PRN
  Administered 2015-02-26: 6.51 via INTRAVENOUS

## 2015-02-27 ENCOUNTER — Ambulatory Visit (HOSPITAL_BASED_OUTPATIENT_CLINIC_OR_DEPARTMENT_OTHER): Payer: BLUE CROSS/BLUE SHIELD

## 2015-02-27 ENCOUNTER — Telehealth: Payer: Self-pay | Admitting: Hematology and Oncology

## 2015-02-27 ENCOUNTER — Other Ambulatory Visit: Payer: BLUE CROSS/BLUE SHIELD

## 2015-02-27 ENCOUNTER — Encounter: Payer: Self-pay | Admitting: *Deleted

## 2015-02-27 ENCOUNTER — Encounter: Payer: Self-pay | Admitting: Hematology and Oncology

## 2015-02-27 ENCOUNTER — Ambulatory Visit (HOSPITAL_BASED_OUTPATIENT_CLINIC_OR_DEPARTMENT_OTHER): Payer: BLUE CROSS/BLUE SHIELD | Admitting: Hematology and Oncology

## 2015-02-27 VITALS — BP 117/66 | HR 93 | Temp 97.9°F | Resp 19 | Ht 60.0 in | Wt 129.2 lb

## 2015-02-27 DIAGNOSIS — C8190 Hodgkin lymphoma, unspecified, unspecified site: Secondary | ICD-10-CM | POA: Diagnosis not present

## 2015-02-27 DIAGNOSIS — Z9189 Other specified personal risk factors, not elsewhere classified: Secondary | ICD-10-CM | POA: Insufficient documentation

## 2015-02-27 DIAGNOSIS — R110 Nausea: Secondary | ICD-10-CM | POA: Diagnosis not present

## 2015-02-27 DIAGNOSIS — R0789 Other chest pain: Secondary | ICD-10-CM | POA: Insufficient documentation

## 2015-02-27 DIAGNOSIS — Z5111 Encounter for antineoplastic chemotherapy: Secondary | ICD-10-CM

## 2015-02-27 MED ORDER — HYDROCODONE-ACETAMINOPHEN 7.5-325 MG PO TABS: 1.0000 | ORAL_TABLET | Freq: Four times a day (QID) | ORAL | Status: DC | PRN

## 2015-02-27 MED ORDER — LIDOCAINE-PRILOCAINE 2.5-2.5 % EX CREA: TOPICAL_CREAM | CUTANEOUS | Status: DC

## 2015-02-27 MED ORDER — ONDANSETRON HCL 8 MG PO TABS: 8.0000 mg | ORAL_TABLET | Freq: Three times a day (TID) | ORAL | Status: DC | PRN

## 2015-02-27 MED ORDER — LEUPROLIDE ACETATE 7.5 MG IM KIT
7.5000 mg | PACK | INTRAMUSCULAR | Status: DC
  Administered 2015-02-27: 7.5 mg via INTRAMUSCULAR
  Filled 2015-02-27: qty 7.5

## 2015-02-27 NOTE — Assessment & Plan Note (Signed)
This has resolved.

## 2015-02-27 NOTE — Assessment & Plan Note (Signed)
We discussed staging. Based on presence of B symptoms and 2-3 LN groups, she would be considered Stage II, non-bulky but unfavorable The NCCN guidelines would recommend 4-6 cycles of chemo. We discussed the role of chemotherapy. The intent is for cure.  We discussed some of the risks, benefits and side-effects of Adriamycin, Bleomycin, Dacarbazine and Vinblastine.   Some of the short term side-effects included, though not limited to, risk of fatigue, weight loss, tumor lysis syndrome, risk of allergic reactions, pulmonary toxicity, pancytopenia, life-threatening infections, need for transfusions of blood products, nausea, vomiting, change in bowel habits, hair loss, risk of congestive heart failure, admission to hospital for various reasons, and risks of death.   Long term side-effects are also discussed including permanent damage to nerve function, chronic fatigue, and rare secondary malignancy including bone marrow disorders.   The patient is aware that the response rates discussed earlier is not guaranteed.    After a long discussion, patient made an informed decision to proceed with the prescribed plan of care and went ahead to sign the consent form today.   Patient education material was dispensed

## 2015-02-27 NOTE — Telephone Encounter (Signed)
Gave and printed appt sched and avs for pt Mallory Price

## 2015-02-27 NOTE — Progress Notes (Signed)
[place] OFFICE PROGRESS NOTE  Patient Care Team: No Pcp Per Patient as PCP - General (General Practice) Izora Gala, MD as Consulting Physician (Otolaryngology)  SUMMARY OF ONCOLOGIC HISTORY:   Hodgkin lymphoma   02/08/2015 Surgery she underwent excisional lymph node biopsy of the left supraclavicular mass   02/08/2015 Pathology Results Accession: [id number] Biopsy showed Classical Hodgkin Lymphoma   02/22/2015 Imaging ECHO is normal   02/25/2015 Procedure She has port placement   02/26/2015 Miscellaneous PFT is normal    INTERVAL HISTORY: Please see below for problem oriented charting. She returns for follow-up. She has mild chest wall tenderness form port placement.  REVIEW OF SYSTEMS:   Constitutional: Denies fevers, chills or abnormal weight loss Eyes: Denies blurriness of vision Ears, nose, mouth, throat, and face: Denies mucositis or sore throat Respiratory: Denies cough, dyspnea or wheezes Cardiovascular: Denies palpitation, chest discomfort or lower extremity swelling Gastrointestinal:  Denies nausea, heartburn or change in bowel habits Skin: Denies abnormal skin rashes Lymphatics: Denies new lymphadenopathy or easy bruising Neurological:Denies numbness, tingling or new weaknesses Behavioral/Psych: Mood is stable, no new changes  All other systems were reviewed with the patient and are negative.  I have reviewed the past medical history, past surgical history, social history and family history with the patient and they are unchanged from previous note.  ALLERGIES:  has No Known Allergies.  MEDICATIONS:  Current Outpatient Prescriptions  Medication Sig Dispense Refill  . etonogestrel-ethinyl estradiol (NUVARING) 0.12-0.015 MG/24HR vaginal ring Place 1 each vaginally every 28 (twenty-eight) days. Insert vaginally and leave in place for 3 consecutive weeks, then remove for 1 week.    . Fiber POWD Take 4 g by mouth 2 (two) times daily.    Marland Kitchen  HYDROcodone-acetaminophen (NORCO) 7.5-325 MG per tablet Take 1 tablet by mouth every 6 (six) hours as needed for moderate pain. 30 tablet 0  . NON FORMULARY Take 1 tablet by mouth 2 (two) times daily. Balance health omega    . NON FORMULARY Take 3 tablets by mouth 2 (two) times daily. Nutralite doubleX multivitamin pack    . NON FORMULARY Take 1 tablet by mouth 2 (two) times daily. Garlic-600mg , peppermint leaf- 30mg , alliin-13.5mg  allicin-6mg  = garlic heart care tab    . NON FORMULARY Take 1 tablet by mouth 2 (two) times daily. Joint health + vit C    . promethazine (PHENERGAN) 25 MG tablet Take 1 tablet (25 mg total) by mouth every 6 (six) hours as needed for nausea or vomiting. 30 tablet 0  . lidocaine-prilocaine (EMLA) cream Apply to affected area once 30 g 3  . ondansetron (ZOFRAN) 8 MG tablet Take 1 tablet (8 mg total) by mouth every 8 (eight) hours as needed for nausea or vomiting. 30 tablet 1   No current facility-administered medications for this visit.    PHYSICAL EXAMINATION: ECOG PERFORMANCE STATUS: 1 - Symptomatic but completely ambulatory  Filed Vitals:   02/27/15 1147  BP: 117/66  Pulse: 93  Temp: 97.9 F (36.6 C)  Resp: 19   Filed Weights   02/27/15 1147  Weight: 129 lb 3.2 oz (58.605 kg)    GENERAL:alert, no distress and comfortable SKIN: skin color, texture, turgor are normal, no rashes or significant lesions EYES: normal, Conjunctiva are pink and non-injected, sclera clear OROPHARYNX:no exudate, no erythema and lips, buccal mucosa, and tongue normal  NECK: supple, thyroid normal size, non-tender, without nodularity LYMPH:  She has persistent palpable lymphadenopathy from cancer LUNGS: clear to auscultation and percussion with  normal breathing effort HEART: regular rate & rhythm and no murmurs and no lower extremity edema ABDOMEN:abdomen soft, non-tender and normal bowel sounds Musculoskeletal:no cyanosis of digits and no clubbing. Port site looks ok NEURO:  alert & oriented x 3 with fluent speech, no focal motor/sensory deficits  LABORATORY DATA:  I have reviewed the data as listed    Component Value Date/Time   NA 140 02/25/2015 1153   K 3.6 02/25/2015 1153   CL 109 02/25/2015 1153   CO2 21* 02/25/2015 1153   GLUCOSE 89 02/25/2015 1153   BUN 8 02/25/2015 1153   CREATININE 0.51 02/25/2015 1153   CALCIUM 8.8* 02/25/2015 1153   GFRNONAA >60 02/25/2015 1153   GFRAA >60 02/25/2015 1153    No results found for: SPEP, UPEP  Lab Results  Component Value Date   WBC 9.5 02/25/2015   NEUTROABS 5.8 01/30/2015   HGB 12.8 02/25/2015   HCT 37.4 02/25/2015   MCV 90.6 02/25/2015   PLT 325 02/25/2015      Chemistry      Component Value Date/Time   NA 140 02/25/2015 1153   K 3.6 02/25/2015 1153   CL 109 02/25/2015 1153   CO2 21* 02/25/2015 1153   BUN 8 02/25/2015 1153   CREATININE 0.51 02/25/2015 1153      Component Value Date/Time   CALCIUM 8.8* 02/25/2015 1153       RADIOGRAPHIC STUDIES: I have personally reviewed the radiological images as listed and agreed with the findings in the report. Nm Pet Image Initial (pi) Skull Base To Thigh  02/26/2015   CLINICAL DATA:  Initial treatment strategy for Hodgkin's lymphoma. Left supraclavicular node excision 02/08/2015.  EXAM: NUCLEAR MEDICINE PET SKULL BASE TO THIGH  TECHNIQUE: 6.51 mCi F-18 FDG was injected intravenously. Full-ring PET imaging was performed from the skull base to thigh after the radiotracer. CT data was obtained and used for attenuation correction and anatomic localization.  FASTING BLOOD GLUCOSE:  Value: 90 mg/dl  COMPARISON:  Chest CT 01/31/2015.  FINDINGS: NECK  No hypermetabolic cervical lymph nodes are identified.There is a new well-circumscribed fluid collection in the left supraclavicular area measuring 4.4 x 2.8 cm, presumably a postsurgical fluid collection related to recent lymph node excision. There is no associated abnormal metabolic activity. There is prominent  metabolic activity throughout the lymphoid tissue of Waldeyer's ring and within the submandibular glands. This activity is homogeneous and symmetric with an SUV max of 7.1. No focal lesions of the pharyngeal mucosal space identified.  CHEST  The previously demonstrated confluent mediastinal lymphadenopathy is hypermetabolic. An approximately 2.9 x 3.8 cm right paratracheal component on image 53 has an SUV max of 15.1. Prevascular/AP window component measuring approximately 5.2 x 3.3 cm on image 52 has an SUV max of 8.1. There is additional hypermetabolic tumor in the right pre-vascular space. There is no hilar or axillary nodal involvement. There is no abnormal metabolic activity within the lungs which remain clear. Right IJ Port-A-Cath tip extends into the upper right atrium.  ABDOMEN/PELVIS  There is no hypermetabolic activity within the liver, adrenal glands, spleen or pancreas. There is no hypermetabolic nodal activity. The spleen is normal in size. There are no enlarged abdominal pelvic lymph nodes.  SKELETON  There is no hypermetabolic activity to suggest osseous metastatic disease. 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  IMPRESSION: 1. Hypermetabolic mediastinal masses consistent with Hodgkin's involvement. 2. Interval excision  of left supraclavicular lymph node with underlying fluid collection in the surgical bed. There is no residual hypermetabolic nodal activity in the supraclavicular regions or elsewhere in the neck. 3. Nonspecific prominent activity within the lymphoid tissue of Waldeyer's ring. 4. No evidence of lymphoma below the diaphragm. The spleen appears unremarkable. 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   Electronically Signed   By: Richardean Sale M.D.   On: 02/26/2015 15:02     ASSESSMENT & PLAN:  Hodgkin lymphoma We discussed staging. Based on presence of B symptoms and 2-3 LN groups,  she would be considered Stage II, non-bulky but unfavorable The NCCN guidelines would recommend 4-6 cycles of chemo. We discussed the role of chemotherapy. The intent is for cure.  We discussed some of the risks, benefits and side-effects of Adriamycin, Bleomycin, Dacarbazine and Vinblastine.   Some of the short term side-effects included, though not limited to, risk of fatigue, weight loss, tumor lysis syndrome, risk of allergic reactions, pulmonary toxicity, pancytopenia, life-threatening infections, need for transfusions of blood products, nausea, vomiting, change in bowel habits, hair loss, risk of congestive heart failure, admission to hospital for various reasons, and risks of death.   Long term side-effects are also discussed including permanent damage to nerve function, chronic fatigue, and rare secondary malignancy including bone marrow disorders.   The patient is aware that the response rates discussed earlier is not guaranteed.    After a long discussion, patient made an informed decision to proceed with the prescribed plan of care and went ahead to sign the consent form today.   Patient education material was dispensed    Chest wall tenderness This is related to her port surgery I refilled pain medication once more  Nausea without vomiting This has resolved  At risk for fertility problems We discussed fertility preservation. I will order Lupron today and then every 4 weeks After she starts chemotherapy, she can discontinue OCP    Orders Placed This Encounter  Procedures  . CBC with Differential    Standing Status: Standing     Number of Occurrences: 20     Standing Expiration Date: 02/28/2016  . Comprehensive metabolic panel    Standing Status: Standing     Number of Occurrences: 20     Standing Expiration Date: 02/28/2016  . Pregnancy, urine    Standing Status: Standing     Number of Occurrences: 33     Standing Expiration Date: 02/27/2016  . PHYSICIAN  COMMUNICATION ORDER    Baseline Pulmonary Function Tests should be obtained prior to initiation of Bleomycin.  Marland Kitchen PHYSICIAN COMMUNICATION ORDER    A baseline Echo/ [place] should be obtained prior to initiation of Anthracycline Chemotherapy   All questions were answered. The patient knows to call the clinic with any problems, questions or concerns. No barriers to learning was detected. I spent 30 minutes counseling the patient face to face. The total time spent in the appointment was 40 minutes and more than 50% was on counseling and review of test results     [facility], [place], MD 02/27/2015 8:15 PM

## 2015-02-27 NOTE — Assessment & Plan Note (Signed)
This is related to her port surgery I refilled pain medication once more

## 2015-02-27 NOTE — Assessment & Plan Note (Addendum)
We discussed fertility preservation. I will order Lupron today and then every 4 weeks After she starts chemotherapy, she can discontinue OCP

## 2015-02-28 ENCOUNTER — Encounter (HOSPITAL_COMMUNITY): Payer: Self-pay

## 2015-02-28 ENCOUNTER — Ambulatory Visit (HOSPITAL_COMMUNITY)
Admission: RE | Admit: 2015-02-28 | Discharge: 2015-02-28 | Disposition: A | Discharge status: Home / Self Care | Payer: BLUE CROSS/BLUE SHIELD | Source: Ambulatory Visit | Attending: Hematology and Oncology | Admitting: Hematology and Oncology

## 2015-02-28 ENCOUNTER — Other Ambulatory Visit (HOSPITAL_COMMUNITY): Payer: Self-pay | Admitting: Hematology and Oncology

## 2015-02-28 VITALS — BP 117/74 | HR 81 | Temp 98.2°F | Resp 18 | Ht 60.0 in | Wt 129.2 lb

## 2015-02-28 DIAGNOSIS — C8190 Hodgkin lymphoma, unspecified, unspecified site: Secondary | ICD-10-CM

## 2015-02-28 DIAGNOSIS — C8590 Non-Hodgkin lymphoma, unspecified, unspecified site: Secondary | ICD-10-CM | POA: Diagnosis not present

## 2015-02-28 LAB — CBC WITH DIFFERENTIAL/PLATELET
Basophils Absolute: 0 10*3/uL (ref 0.0–0.1)
Basophils Relative: 0 % (ref 0–1)
EOS PCT: 4 % (ref 0–5)
Eosinophils Absolute: 0.3 10*3/uL (ref 0.0–0.7)
HCT: 36.7 % (ref 36.0–46.0)
Hemoglobin: 12.2 g/dL (ref 12.0–15.0)
LYMPHS ABS: 2.1 10*3/uL (ref 0.7–4.0)
LYMPHS PCT: 25 % (ref 12–46)
MCH: 30 pg (ref 26.0–34.0)
MCHC: 33.2 g/dL (ref 30.0–36.0)
MCV: 90.4 fL (ref 78.0–100.0)
MONOS PCT: 8 % (ref 3–12)
Monocytes Absolute: 0.6 10*3/uL (ref 0.1–1.0)
Neutro Abs: 5.1 10*3/uL (ref 1.7–7.7)
Neutrophils Relative %: 63 % (ref 43–77)
PLATELETS: 319 10*3/uL (ref 150–400)
RBC: 4.06 MIL/uL (ref 3.87–5.11)
RDW: 12.6 % (ref 11.5–15.5)
WBC: 8.2 10*3/uL (ref 4.0–10.5)

## 2015-02-28 LAB — LACTATE DEHYDROGENASE: LDH: 194 U/L — ABNORMAL HIGH (ref 98–192)

## 2015-02-28 LAB — COMPREHENSIVE METABOLIC PANEL
ALT: 16 U/L (ref 14–54)
ANION GAP: 11 (ref 5–15)
AST: 22 U/L (ref 15–41)
Albumin: 3.5 g/dL (ref 3.5–5.0)
Alkaline Phosphatase: 69 U/L (ref 38–126)
BILIRUBIN TOTAL: 0.6 mg/dL (ref 0.3–1.2)
BUN: 13 mg/dL (ref 6–20)
CO2: 22 mmol/L (ref 22–32)
Calcium: 8.9 mg/dL (ref 8.9–10.3)
Chloride: 106 mmol/L (ref 101–111)
Creatinine, Ser: 0.66 mg/dL (ref 0.44–1.00)
GFR calc Af Amer: >60 mL/min (ref >60)
Glucose, Bld: 98 mg/dL (ref 65–99)
Potassium: 3.4 mmol/L — ABNORMAL LOW (ref 3.5–5.1)
Sodium: 139 mmol/L (ref 135–145)
Total Protein: 7.4 g/dL (ref 6.5–8.1)

## 2015-02-28 LAB — URIC ACID: URIC ACID, SERUM: 4.4 mg/dL (ref 2.3–6.6)

## 2015-02-28 LAB — BONE MARROW EXAM

## 2015-02-28 LAB — SEDIMENTATION RATE: Sed Rate: 28 mm/hr — ABNORMAL HIGH (ref 0–22)

## 2015-02-28 LAB — PREGNANCY, URINE: PREG TEST UR: NEGATIVE

## 2015-02-28 MED ORDER — MIDAZOLAM HCL 10 MG/2ML IJ SOLN
10.0000 mg | Freq: Once | INTRAMUSCULAR | Status: AC
  Administered 2015-02-28: 4 mg via INTRAVENOUS
  Filled 2015-02-28: qty 2

## 2015-02-28 MED ORDER — SODIUM CHLORIDE 0.9 % IV SOLN
Freq: Once | INTRAVENOUS | Status: AC
  Administered 2015-02-28: 500 mL via INTRAVENOUS

## 2015-02-28 MED ORDER — FENTANYL CITRATE (PF) 100 MCG/2ML IJ SOLN
100.0000 ug | Freq: Once | INTRAMUSCULAR | Status: AC
  Administered 2015-02-28: 50 ug via INTRAVENOUS
  Filled 2015-02-28: qty 2

## 2015-02-28 NOTE — Discharge Instructions (Signed)

## 2015-02-28 NOTE — Progress Notes (Signed)
Note, during procedure, EPIC completely shut down and screen went black.  I did check charting and added to EPIC, note just [place],

## 2015-02-28 NOTE — Sedation Documentation (Signed)
dsg cdi 

## 2015-02-28 NOTE — Sedation Documentation (Signed)
Patient denies pain and is resting comfortably.  

## 2015-02-28 NOTE — Sedation Documentation (Signed)
Family updated as to patient's status. Father at bedside

## 2015-02-28 NOTE — Sedation Documentation (Signed)
cdi dsg 

## 2015-02-28 NOTE — Sedation Documentation (Signed)
Patient is resting comfortably. 

## 2015-02-28 NOTE — Sedation Documentation (Signed)
Family updated as to patient's status.

## 2015-02-28 NOTE — Sedation Documentation (Signed)
Vital signs stable. 

## 2015-02-28 NOTE — Sedation Documentation (Signed)
Patient identified as Sport and exercise psychologist by  Dr gorsuch/  And Peyten Benjiman Sedgwick rn

## 2015-02-28 NOTE — Procedures (Signed)
Brief examination was performed. ENT: adequate airway clearance Heart: regular rate and rhythm.No Murmurs Lungs: clear to auscultation, no wheezes, normal respiratory effort  American Society of Anesthesiologists ASA scale 1  Mallampati Score of 2  Bone Marrow Biopsy and Aspiration Procedure Note   Informed consent was obtained and potential risks including bleeding, infection and pain were reviewed with the patient. I verified that the patient has been fasting since midnight.  The patient's name, date of birth, identification, consent and allergies were verified prior to the start of procedure and time out was performed.  A total of 8 mg of IV Versed and 75 mcg of IV fentanyl were given.  The right posterior iliac crest was chosen as the site of biopsy.  The skin was prepped with Betadine solution.   8 cc of 1% lidocaine was used to provide local anaesthesia.   10 cc of bone marrow aspirate was obtained followed by 1 inch biopsy.   The procedure was tolerated well and there were no complications.  The patient was stable at the end of the procedure.  Specimens sent for flow cytometry, cytogenetics and additional studies.

## 2015-02-28 NOTE — Sedation Documentation (Signed)
dsg applied to sacral area by md/cdi

## 2015-02-28 NOTE — Sedation Documentation (Signed)
Medication dose calculated and verified for: Mallory Price  8mg  versed 65mcg fentanyl

## 2015-03-01 ENCOUNTER — Ambulatory Visit (HOSPITAL_BASED_OUTPATIENT_CLINIC_OR_DEPARTMENT_OTHER): Payer: BLUE CROSS/BLUE SHIELD

## 2015-03-01 VITALS — BP 109/64 | HR 95 | Temp 98.6°F | Resp 20

## 2015-03-01 DIAGNOSIS — Z5111 Encounter for antineoplastic chemotherapy: Secondary | ICD-10-CM | POA: Diagnosis not present

## 2015-03-01 DIAGNOSIS — C8190 Hodgkin lymphoma, unspecified, unspecified site: Secondary | ICD-10-CM | POA: Diagnosis not present

## 2015-03-01 MED ORDER — SODIUM CHLORIDE 0.9 % IV SOLN
10.0000 [IU]/m2 | Freq: Once | INTRAVENOUS | Status: AC
  Administered 2015-03-01: 16 [IU] via INTRAVENOUS
  Filled 2015-03-01: qty 5.33

## 2015-03-01 MED ORDER — SODIUM CHLORIDE 0.9 % IV SOLN
10.0000 mg | Freq: Once | INTRAVENOUS | Status: AC
  Administered 2015-03-01: 10 mg via INTRAVENOUS
  Filled 2015-03-01: qty 10

## 2015-03-01 MED ORDER — SODIUM CHLORIDE 0.9 % IV SOLN
Freq: Once | INTRAVENOUS | Status: AC
  Administered 2015-03-01: 10:00:00 via INTRAVENOUS
  Filled 2015-03-01: qty 8

## 2015-03-01 MED ORDER — HEPARIN SOD (PORK) LOCK FLUSH 100 UNIT/ML IV SOLN
500.0000 [IU] | Freq: Once | INTRAVENOUS | Status: AC | PRN
  Administered 2015-03-01: 500 [IU]
  Filled 2015-03-01: qty 5

## 2015-03-01 MED ORDER — SODIUM CHLORIDE 0.9 % IJ SOLN
10.0000 mL | INTRAMUSCULAR | Status: DC | PRN
  Administered 2015-03-01: 10 mL
  Filled 2015-03-01: qty 10

## 2015-03-01 MED ORDER — DOXORUBICIN HCL CHEMO IV INJECTION 2 MG/ML
25.0000 mg/m2 | Freq: Once | INTRAVENOUS | Status: AC
  Administered 2015-03-01: 40 mg via INTRAVENOUS
  Filled 2015-03-01: qty 20

## 2015-03-01 MED ORDER — SODIUM CHLORIDE 0.9 % IV SOLN
Freq: Once | INTRAVENOUS | Status: AC
  Administered 2015-03-01: 10:00:00 via INTRAVENOUS

## 2015-03-01 MED ORDER — SODIUM CHLORIDE 0.9 % IV SOLN
375.0000 mg/m2 | Freq: Once | INTRAVENOUS | Status: AC
  Administered 2015-03-01: 600 mg via INTRAVENOUS
  Filled 2015-03-01: qty 30

## 2015-03-01 NOTE — Patient Instructions (Signed)
[facility] Discharge Instructions for Patients Receiving Chemotherapy  Today you received the following chemotherapy agents:  Adriamycin, Vinblastine, Bleomycin and Dacarbazine  To help prevent nausea and vomiting after your treatment, we encourage you to take your nausea medication as ordered per MD.   If you develop nausea and vomiting that is not controlled by your nausea medication, call the clinic.   BELOW ARE SYMPTOMS THAT SHOULD BE REPORTED IMMEDIATELY:  *FEVER GREATER THAN 100.5 F  *CHILLS WITH OR WITHOUT FEVER  NAUSEA AND VOMITING THAT IS NOT CONTROLLED WITH YOUR NAUSEA MEDICATION  *UNUSUAL SHORTNESS OF BREATH  *UNUSUAL BRUISING OR BLEEDING  TENDERNESS IN MOUTH AND THROAT WITH OR WITHOUT PRESENCE OF ULCERS  *URINARY PROBLEMS  *BOWEL PROBLEMS  UNUSUAL RASH Items with * indicate a potential emergency and should be followed up as soon as possible.  Feel free to call the clinic you have any questions or concerns. The clinic phone number is [phone].  Please show the [facility] at check-in to the Emergency Department and triage nurse.

## 2015-03-04 ENCOUNTER — Other Ambulatory Visit (HOSPITAL_COMMUNITY): Payer: BLUE CROSS/BLUE SHIELD

## 2015-03-04 ENCOUNTER — Telehealth: Payer: Self-pay | Admitting: *Deleted

## 2015-03-04 NOTE — Telephone Encounter (Signed)
Pt states some nausea for several days after chemo,  It is better today.  She has been taking zofran every morning preventively.  She has had some fatigue.  Denies any fevers, diarrhea, constipation or mouth sores.  Discussed normal side effects of chemo and to call us for any fever over 100.5 F or any other concerns or questions prior to next visit.  She verbalized understanding.

## 2015-03-05 ENCOUNTER — Telehealth: Payer: Self-pay | Admitting: *Deleted

## 2015-03-05 NOTE — Telephone Encounter (Signed)
Received call from pt stating that she was given a fertility shot & was told that she wouldn't have her cycle & she woke up this am with her cycle.  She also reports that she was told to take Nuvoring out @ 4 days after shot & she has done this. Reviewed pt's chart & she had lupron 6/8 & chemo ABVD 6/10.  Encouraged pt to use 2 forms of birth control for now & it might take a little while for the lupron to get in her system.  Message left for Dr Calton Dach RN to f/u with pt.

## 2015-03-06 ENCOUNTER — Telehealth: Payer: Self-pay | Admitting: *Deleted

## 2015-03-06 NOTE — Telephone Encounter (Signed)
Instructed Mallory Price per Dr. Alvy Bimler that some breakthrough menstrual bleeding can be normal w/ chemo even w/ the Lupron injection.  Instructed Mallory Price per Dr. Alvy Bimler to start using the Nuva ring again for one more cycle.   Mallory Price verbalized understanding.

## 2015-03-08 LAB — CHROMOSOME ANALYSIS, BONE MARROW

## 2015-03-14 ENCOUNTER — Encounter: Payer: Self-pay | Admitting: Hematology and Oncology

## 2015-03-14 ENCOUNTER — Ambulatory Visit (HOSPITAL_BASED_OUTPATIENT_CLINIC_OR_DEPARTMENT_OTHER): Payer: BLUE CROSS/BLUE SHIELD | Admitting: Hematology and Oncology

## 2015-03-14 ENCOUNTER — Telehealth: Payer: Self-pay | Admitting: Hematology and Oncology

## 2015-03-14 ENCOUNTER — Other Ambulatory Visit (HOSPITAL_BASED_OUTPATIENT_CLINIC_OR_DEPARTMENT_OTHER): Payer: BLUE CROSS/BLUE SHIELD

## 2015-03-14 VITALS — BP 119/73 | HR 88 | Temp 98.1°F | Resp 18 | Ht 60.0 in | Wt 127.2 lb

## 2015-03-14 DIAGNOSIS — R110 Nausea: Secondary | ICD-10-CM | POA: Diagnosis not present

## 2015-03-14 DIAGNOSIS — T451X5A Adverse effect of antineoplastic and immunosuppressive drugs, initial encounter: Secondary | ICD-10-CM

## 2015-03-14 DIAGNOSIS — C8190 Hodgkin lymphoma, unspecified, unspecified site: Secondary | ICD-10-CM | POA: Diagnosis not present

## 2015-03-14 DIAGNOSIS — Z9189 Other specified personal risk factors, not elsewhere classified: Secondary | ICD-10-CM

## 2015-03-14 LAB — COMPREHENSIVE METABOLIC PANEL (CC13)
ALBUMIN: 3.9 g/dL (ref 3.5–5.0)
ALK PHOS: 65 U/L (ref 40–150)
ALT: 21 U/L (ref 0–55)
AST: 20 U/L (ref 5–34)
Anion Gap: 9 mEq/L (ref 3–11)
BILIRUBIN TOTAL: 1.11 mg/dL (ref 0.20–1.20)
BUN: 11.9 mg/dL (ref 7.0–26.0)
CO2: 24 meq/L (ref 22–29)
Calcium: 9.1 mg/dL (ref 8.4–10.4)
Chloride: 107 mEq/L (ref 98–109)
Creatinine: 0.8 mg/dL (ref 0.6–1.1)
EGFR: >90 mL/min/{1.73_m2} (ref >90)
Glucose: 82 mg/dl (ref 70–140)
Potassium: 3.9 mEq/L (ref 3.5–5.1)
SODIUM: 140 meq/L (ref 136–145)
TOTAL PROTEIN: 7.2 g/dL (ref 6.4–8.3)

## 2015-03-14 LAB — CBC WITH DIFFERENTIAL/PLATELET
BASO%: 0.7 % (ref 0.0–2.0)
Basophils Absolute: 0 10*3/uL (ref 0.0–0.1)
EOS%: 2.4 % (ref 0.0–7.0)
Eosinophils Absolute: 0.1 10*3/uL (ref 0.0–0.5)
HEMATOCRIT: 38.5 % (ref 34.8–46.6)
HEMOGLOBIN: 13.2 g/dL (ref 11.6–15.9)
LYMPH#: 1.4 10*3/uL (ref 0.9–3.3)
LYMPH%: 39.5 % (ref 14.0–49.7)
MCH: 31.1 pg (ref 25.1–34.0)
MCHC: 34.3 g/dL (ref 31.5–36.0)
MCV: 90.6 fL (ref 79.5–101.0)
MONO#: 0.3 10*3/uL (ref 0.1–0.9)
MONO%: 9.8 % (ref 0.0–14.0)
NEUT#: 1.6 10*3/uL (ref 1.5–6.5)
NEUT%: 47.6 % (ref 38.4–76.8)
Platelets: 252 10*3/uL (ref 145–400)
RBC: 4.25 10*6/uL (ref 3.70–5.45)
RDW: 13.2 % (ref 11.2–14.5)
WBC: 3.4 10*3/uL — ABNORMAL LOW (ref 3.9–10.3)

## 2015-03-14 LAB — PREGNANCY, URINE: Preg Test, Ur: NEGATIVE

## 2015-03-14 MED ORDER — ONDANSETRON HCL 8 MG PO TABS: 8.0000 mg | ORAL_TABLET | Freq: Three times a day (TID) | ORAL | Status: DC | PRN

## 2015-03-14 NOTE — Assessment & Plan Note (Signed)
As mentioned above, I will recommend prophylactic Zofran for 2 days after each cycle. If her nausea is still bad, I will add Emend to future treatment.

## 2015-03-14 NOTE — Assessment & Plan Note (Signed)
We discussed fertility preservation. I will order Lupron every 4 weeks

## 2015-03-14 NOTE — Progress Notes (Signed)
[facility] OFFICE PROGRESS NOTE  Patient Care Team: No Pcp Per Patient as PCP - General (General Practice) Mallory Gala, MD as Consulting Physician (Otolaryngology)  SUMMARY OF ONCOLOGIC HISTORY:   Hodgkin lymphoma   02/08/2015 Surgery she underwent excisional lymph node biopsy of the left supraclavicular mass   02/08/2015 Pathology Results Accession: [id number] Biopsy showed Classical Hodgkin Lymphoma   02/22/2015 Imaging ECHO is normal   02/25/2015 Procedure She has port placement   02/26/2015 Miscellaneous PFT is normal   02/28/2015 Bone Marrow Biopsy Accession: [id number] BM biopsy was negative   03/01/2015 -  Chemotherapy She received ABVD    INTERVAL HISTORY: She returns to be seen prior to day 15 cycle 1 of treatment Please see below for problem oriented charting. She had chemotherapy-induced nausea for several days but no vomiting. She have occasional night sweats after treatment. She has a fine rash on the right side of the neck. Denies any recent menorrhagia. The lymphadenopathy in the left supraclavicular region is getting smaller. She denies new lymphadenopathy.  REVIEW OF SYSTEMS:   Constitutional: Denies fevers, chills or abnormal weight loss Eyes: Denies blurriness of vision Ears, nose, mouth, throat, and face: Denies mucositis or sore throat Respiratory: Denies cough, dyspnea or wheezes Cardiovascular: Denies palpitation, chest discomfort or lower extremity swelling Gastrointestinal:  Denies nausea, heartburn or change in bowel habits Lymphatics: Denies new lymphadenopathy or easy bruising Neurological:Denies numbness, tingling or new weaknesses Behavioral/Psych: Mood is stable, no new changes  All other systems were reviewed with the patient and are negative.  I have reviewed the past medical history, past surgical history, social history and family history with the patient and they are unchanged from previous note.  ALLERGIES:  has No Known  Allergies.  MEDICATIONS:  Current Outpatient Prescriptions  Medication Sig Dispense Refill  . etonogestrel-ethinyl estradiol (NUVARING) 0.12-0.015 MG/24HR vaginal ring Place 1 each vaginally every 28 (twenty-eight) days. Insert vaginally and leave in place for 3 consecutive weeks, then remove for 1 week.    . Fiber POWD Take 4 g by mouth 2 (two) times daily.    Marland Kitchen HYDROcodone-acetaminophen (NORCO) 7.5-325 MG per tablet Take 1 tablet by mouth every 6 (six) hours as needed for moderate pain. 30 tablet 0  . lidocaine-prilocaine (EMLA) cream Apply to affected area once 30 g 3  . NON FORMULARY Take 1 tablet by mouth 2 (two) times daily. Balance health omega    . NON FORMULARY Take 3 tablets by mouth 2 (two) times daily. Nutralite doubleX multivitamin pack    . NON FORMULARY Take 1 tablet by mouth 2 (two) times daily. Garlic-645m, peppermint leaf- 351m [id number] garlic heart care tab    . NON FORMULARY Take 1 tablet by mouth 2 (two) times daily. Joint health + vit C    . ondansetron (ZOFRAN) 8 MG tablet Take 1 tablet (8 mg total) by mouth every 8 (eight) hours as needed for nausea or vomiting. 90 tablet 3  . promethazine (PHENERGAN) 25 MG tablet Take 1 tablet (25 mg total) by mouth every 6 (six) hours as needed for nausea or vomiting. 30 tablet 0   No current facility-administered medications for this visit.    PHYSICAL EXAMINATION: ECOG PERFORMANCE STATUS: 1 - Symptomatic but completely ambulatory  Filed Vitals:   03/14/15 1412  BP: 119/73  Pulse: 88  Temp: 98.1 F (36.7 C)  Resp: 18   Filed Weights   03/14/15 1412  Weight: 127 lb 3.2 oz (57.698 kg)  GENERAL:alert, no distress and comfortable SKIN: She has a fine rash on the right side of the neck.  EYES: normal, Conjunctiva are pink and non-injected, sclera clear OROPHARYNX:no exudate, no erythema and lips, buccal mucosa, and tongue normal  NECK: supple, thyroid normal size, non-tender, without  nodularity LYMPH:  Persistent palpable lymphadenopathy in the left supraclavicular region, smaller. LUNGS: clear to auscultation and percussion with normal breathing effort HEART: regular rate & rhythm and no murmurs and no lower extremity edema ABDOMEN:abdomen soft, non-tender and normal bowel sounds Musculoskeletal:no cyanosis of digits and no clubbing  NEURO: alert & oriented x 3 with fluent speech, no focal motor/sensory deficits  LABORATORY DATA:  I have reviewed the data as listed    Component Value Date/Time   NA 140 03/14/2015 1357   NA 139 02/28/2015 0740   K 3.9 03/14/2015 1357   K 3.4* 02/28/2015 0740   CL 106 02/28/2015 0740   CO2 24 03/14/2015 1357   CO2 22 02/28/2015 0740   GLUCOSE 82 03/14/2015 1357   GLUCOSE 98 02/28/2015 0740   BUN 11.9 03/14/2015 1357   BUN 13 02/28/2015 0740   CREATININE 0.8 03/14/2015 1357   CREATININE 0.66 02/28/2015 0740   CALCIUM 9.1 03/14/2015 1357   CALCIUM 8.9 02/28/2015 0740   PROT 7.2 03/14/2015 1357   PROT 7.4 02/28/2015 0740   ALBUMIN 3.9 03/14/2015 1357   ALBUMIN 3.5 02/28/2015 0740   AST 20 03/14/2015 1357   AST 22 02/28/2015 0740   ALT 21 03/14/2015 1357   ALT 16 02/28/2015 0740   ALKPHOS 65 03/14/2015 1357   ALKPHOS 69 02/28/2015 0740   BILITOT 1.11 03/14/2015 1357   BILITOT 0.6 02/28/2015 0740   GFRNONAA >60 02/28/2015 0740   GFRAA >60 02/28/2015 0740    No results found for: SPEP, UPEP  Lab Results  Component Value Date   WBC 3.4* 03/14/2015   NEUTROABS 1.6 03/14/2015   HGB 13.2 03/14/2015   HCT 38.5 03/14/2015   MCV 90.6 03/14/2015   PLT 252 03/14/2015      Chemistry      Component Value Date/Time   NA 140 03/14/2015 1357   NA 139 02/28/2015 0740   K 3.9 03/14/2015 1357   K 3.4* 02/28/2015 0740   CL 106 02/28/2015 0740   CO2 24 03/14/2015 1357   CO2 22 02/28/2015 0740   BUN 11.9 03/14/2015 1357   BUN 13 02/28/2015 0740   CREATININE 0.8 03/14/2015 1357   CREATININE 0.66 02/28/2015 0740       Component Value Date/Time   CALCIUM 9.1 03/14/2015 1357   CALCIUM 8.9 02/28/2015 0740   ALKPHOS 65 03/14/2015 1357   ALKPHOS 69 02/28/2015 0740   AST 20 03/14/2015 1357   AST 22 02/28/2015 0740   ALT 21 03/14/2015 1357   ALT 16 02/28/2015 0740   BILITOT 1.11 03/14/2015 1357   BILITOT 0.6 02/28/2015 0740      ASSESSMENT & PLAN:  Hodgkin lymphoma She tolerated treatment well from mild nausea after treatment which subsequently resolved. I recommend she takes prophylactic Zofran for 2 days after chemotherapy to prevent delayed nausea. I reviewed with her the most current NCCN guidelines. She is considered unfavorable stage IIA due to presence of B symptoms. Plan would be to do 3 cycles of ABVD before repeat staging PET CT scan.  At risk for fertility problems We discussed fertility preservation. I will order Lupron every 4 weeks   Chemotherapy-induced nausea As mentioned above, I will recommend prophylactic Zofran  for 2 days after each cycle. If her nausea is still bad, I will add Emend to future treatment.   No orders of the defined types were placed in this encounter.   All questions were answered. The patient knows to call the clinic with any problems, questions or concerns. No barriers to learning was detected. I spent 25 minutes counseling the patient face to face. The total time spent in the appointment was 30 minutes and more than 50% was on counseling and review of test results     [facility], [place], MD 03/14/2015 3:01 PM

## 2015-03-14 NOTE — Telephone Encounter (Signed)
Gave patient avs report and appointments for July.  °

## 2015-03-14 NOTE — Assessment & Plan Note (Signed)
She tolerated treatment well from mild nausea after treatment which subsequently resolved. I recommend she takes prophylactic Zofran for 2 days after chemotherapy to prevent delayed nausea. I reviewed with her the most current NCCN guidelines. She is considered unfavorable stage IIA due to presence of B symptoms. Plan would be to do 3 cycles of ABVD before repeat staging PET CT scan.

## 2015-03-15 ENCOUNTER — Ambulatory Visit (HOSPITAL_BASED_OUTPATIENT_CLINIC_OR_DEPARTMENT_OTHER): Payer: BLUE CROSS/BLUE SHIELD

## 2015-03-15 VITALS — BP 103/61 | HR 88 | Temp 98.1°F | Resp 18

## 2015-03-15 DIAGNOSIS — Z5111 Encounter for antineoplastic chemotherapy: Secondary | ICD-10-CM | POA: Diagnosis not present

## 2015-03-15 DIAGNOSIS — C8190 Hodgkin lymphoma, unspecified, unspecified site: Secondary | ICD-10-CM

## 2015-03-15 MED ORDER — HEPARIN SOD (PORK) LOCK FLUSH 100 UNIT/ML IV SOLN
500.0000 [IU] | Freq: Once | INTRAVENOUS | Status: AC | PRN
  Administered 2015-03-15: 500 [IU]
  Filled 2015-03-15: qty 5

## 2015-03-15 MED ORDER — SODIUM CHLORIDE 0.9 % IV SOLN
Freq: Once | INTRAVENOUS | Status: AC
  Administered 2015-03-15: 12:00:00 via INTRAVENOUS

## 2015-03-15 MED ORDER — DACARBAZINE 200 MG IV SOLR
375.0000 mg/m2 | Freq: Once | INTRAVENOUS | Status: AC
  Administered 2015-03-15: 600 mg via INTRAVENOUS
  Filled 2015-03-15: qty 30

## 2015-03-15 MED ORDER — DOXORUBICIN HCL CHEMO IV INJECTION 2 MG/ML
25.0000 mg/m2 | Freq: Once | INTRAVENOUS | Status: AC
  Administered 2015-03-15: 40 mg via INTRAVENOUS
  Filled 2015-03-15: qty 20

## 2015-03-15 MED ORDER — SODIUM CHLORIDE 0.9 % IJ SOLN
10.0000 mL | INTRAMUSCULAR | Status: DC | PRN
  Administered 2015-03-15: 10 mL
  Filled 2015-03-15: qty 10

## 2015-03-15 MED ORDER — SODIUM CHLORIDE 0.9 % IV SOLN
Freq: Once | INTRAVENOUS | Status: AC
  Administered 2015-03-15: 13:00:00 via INTRAVENOUS
  Filled 2015-03-15: qty 8

## 2015-03-15 MED ORDER — VINBLASTINE SULFATE CHEMO INJECTION 1 MG/ML
10.0000 mg | Freq: Once | INTRAVENOUS | Status: AC
  Administered 2015-03-15: 10 mg via INTRAVENOUS
  Filled 2015-03-15: qty 10

## 2015-03-15 MED ORDER — SODIUM CHLORIDE 0.9 % IV SOLN
10.0000 [IU]/m2 | Freq: Once | INTRAVENOUS | Status: AC
  Administered 2015-03-15: 16 [IU] via INTRAVENOUS
  Filled 2015-03-15: qty 5.33

## 2015-03-15 NOTE — Patient Instructions (Signed)
[facility] Discharge Instructions for Patients Receiving Chemotherapy  Today you received the following chemotherapy agents DTIC, Bleomycin, Dacarbaxine, Vinblastine  To help prevent nausea and vomiting after your treatment, we encourage you to take your nausea medication as needed   If you develop nausea and vomiting that is not controlled by your nausea medication, call the clinic.   BELOW ARE SYMPTOMS THAT SHOULD BE REPORTED IMMEDIATELY:  *FEVER GREATER THAN 100.5 F  *CHILLS WITH OR WITHOUT FEVER  NAUSEA AND VOMITING THAT IS NOT CONTROLLED WITH YOUR NAUSEA MEDICATION  *UNUSUAL SHORTNESS OF BREATH  *UNUSUAL BRUISING OR BLEEDING  TENDERNESS IN MOUTH AND THROAT WITH OR WITHOUT PRESENCE OF ULCERS  *URINARY PROBLEMS  *BOWEL PROBLEMS  UNUSUAL RASH Items with * indicate a potential emergency and should be followed up as soon as possible.  Feel free to call the clinic you have any questions or concerns. The clinic phone number is [phone].  Please show the [place] at check-in to the Emergency Department and triage nurse.

## 2015-03-18 ENCOUNTER — Telehealth: Payer: Self-pay | Admitting: *Deleted

## 2015-03-18 ENCOUNTER — Other Ambulatory Visit: Payer: Self-pay | Admitting: Hematology and Oncology

## 2015-03-18 NOTE — Telephone Encounter (Signed)
-----   Message from Heath Lark, MD sent at 03/18/2015  8:06 AM EDT ----- Regarding: nausea Can you call her and see how she is in terms on nausea?

## 2015-03-18 NOTE — Telephone Encounter (Signed)
Left patient a message to call. Dr Alvy Bimler wants to know how she doing with nausea

## 2015-03-18 NOTE — Telephone Encounter (Signed)
Pt instructed to alternate zofran with phenergan per Dr Alvy Bimler. Pt had stated she was taking zofran every 8 hours, still feeling nauseated, no vomiting. Dr Alvy Bimler notified- will adjust antiemetics with next chemo

## 2015-03-19 ENCOUNTER — Encounter (HOSPITAL_COMMUNITY): Payer: Self-pay

## 2015-03-29 ENCOUNTER — Other Ambulatory Visit (HOSPITAL_BASED_OUTPATIENT_CLINIC_OR_DEPARTMENT_OTHER): Payer: BLUE CROSS/BLUE SHIELD

## 2015-03-29 ENCOUNTER — Ambulatory Visit (HOSPITAL_BASED_OUTPATIENT_CLINIC_OR_DEPARTMENT_OTHER): Payer: BLUE CROSS/BLUE SHIELD

## 2015-03-29 ENCOUNTER — Encounter: Payer: Self-pay | Admitting: Hematology and Oncology

## 2015-03-29 ENCOUNTER — Ambulatory Visit: Payer: BLUE CROSS/BLUE SHIELD

## 2015-03-29 ENCOUNTER — Other Ambulatory Visit (HOSPITAL_COMMUNITY)
Admission: RE | Admit: 2015-03-29 | Discharge: 2015-03-29 | Disposition: A | Discharge status: Home / Self Care | Payer: BLUE CROSS/BLUE SHIELD | Source: Ambulatory Visit | Attending: Hematology and Oncology | Admitting: Hematology and Oncology

## 2015-03-29 ENCOUNTER — Telehealth: Payer: Self-pay | Admitting: Hematology and Oncology

## 2015-03-29 ENCOUNTER — Ambulatory Visit (HOSPITAL_BASED_OUTPATIENT_CLINIC_OR_DEPARTMENT_OTHER): Payer: BLUE CROSS/BLUE SHIELD | Admitting: Hematology and Oncology

## 2015-03-29 ENCOUNTER — Telehealth: Payer: Self-pay | Admitting: *Deleted

## 2015-03-29 VITALS — BP 111/63 | HR 77 | Temp 98.0°F | Resp 18 | Ht 60.0 in | Wt 134.3 lb

## 2015-03-29 DIAGNOSIS — R110 Nausea: Secondary | ICD-10-CM | POA: Diagnosis not present

## 2015-03-29 DIAGNOSIS — D72819 Decreased white blood cell count, unspecified: Secondary | ICD-10-CM

## 2015-03-29 DIAGNOSIS — Z95828 Presence of other vascular implants and grafts: Secondary | ICD-10-CM

## 2015-03-29 DIAGNOSIS — G4700 Insomnia, unspecified: Secondary | ICD-10-CM

## 2015-03-29 DIAGNOSIS — C8190 Hodgkin lymphoma, unspecified, unspecified site: Secondary | ICD-10-CM | POA: Insufficient documentation

## 2015-03-29 DIAGNOSIS — Z9189 Other specified personal risk factors, not elsewhere classified: Secondary | ICD-10-CM

## 2015-03-29 DIAGNOSIS — Z5111 Encounter for antineoplastic chemotherapy: Secondary | ICD-10-CM | POA: Diagnosis not present

## 2015-03-29 DIAGNOSIS — D701 Agranulocytosis secondary to cancer chemotherapy: Secondary | ICD-10-CM

## 2015-03-29 DIAGNOSIS — T451X5A Adverse effect of antineoplastic and immunosuppressive drugs, initial encounter: Secondary | ICD-10-CM

## 2015-03-29 DIAGNOSIS — L270 Generalized skin eruption due to drugs and medicaments taken internally: Secondary | ICD-10-CM | POA: Insufficient documentation

## 2015-03-29 HISTORY — DX: Insomnia, unspecified: G47.00

## 2015-03-29 LAB — COMPREHENSIVE METABOLIC PANEL (CC13)
ALBUMIN: 3.3 g/dL — AB (ref 3.5–5.0)
ALK PHOS: 41 U/L (ref 40–150)
ALT: 10 U/L (ref 0–55)
AST: 15 U/L (ref 5–34)
Anion Gap: 8 mEq/L (ref 3–11)
BUN: 10.4 mg/dL (ref 7.0–26.0)
CHLORIDE: 111 meq/L — AB (ref 98–109)
CO2: 21 mEq/L — ABNORMAL LOW (ref 22–29)
Calcium: 8.6 mg/dL (ref 8.4–10.4)
Creatinine: 0.7 mg/dL (ref 0.6–1.1)
EGFR: >90 mL/min/{1.73_m2} (ref >90)
Glucose: 70 mg/dl (ref 70–140)
POTASSIUM: 4.1 meq/L (ref 3.5–5.1)
Sodium: 140 mEq/L (ref 136–145)
Total Bilirubin: 0.45 mg/dL (ref 0.20–1.20)
Total Protein: 6.1 g/dL — ABNORMAL LOW (ref 6.4–8.3)

## 2015-03-29 LAB — CBC WITH DIFFERENTIAL/PLATELET
BASO%: 0.7 % (ref 0.0–2.0)
BASOS ABS: 0 10*3/uL (ref 0.0–0.1)
EOS%: 5.2 % (ref 0.0–7.0)
Eosinophils Absolute: 0.2 10*3/uL (ref 0.0–0.5)
HEMATOCRIT: 34.8 % (ref 34.8–46.6)
HEMOGLOBIN: 11.6 g/dL (ref 11.6–15.9)
LYMPH#: 1.4 10*3/uL (ref 0.9–3.3)
LYMPH%: 48.3 % (ref 14.0–49.7)
MCH: 31.1 pg (ref 25.1–34.0)
MCHC: 33.3 g/dL (ref 31.5–36.0)
MCV: 93.3 fL (ref 79.5–101.0)
MONO#: 0.4 10*3/uL (ref 0.1–0.9)
MONO%: 14.2 % — ABNORMAL HIGH (ref 0.0–14.0)
NEUT#: 0.9 10*3/uL — ABNORMAL LOW (ref 1.5–6.5)
NEUT%: 31.6 % — AB (ref 38.4–76.8)
Platelets: 221 10*3/uL (ref 145–400)
RBC: 3.73 10*6/uL (ref 3.70–5.45)
RDW: 14.6 % — ABNORMAL HIGH (ref 11.2–14.5)
WBC: 2.9 10*3/uL — AB (ref 3.9–10.3)

## 2015-03-29 LAB — PREGNANCY, URINE: Preg Test, Ur: NEGATIVE

## 2015-03-29 MED ORDER — PALONOSETRON HCL INJECTION 0.25 MG/5ML
INTRAVENOUS | Status: AC
  Filled 2015-03-29: qty 5

## 2015-03-29 MED ORDER — SODIUM CHLORIDE 0.9 % IV SOLN
10.0000 [IU]/m2 | Freq: Once | INTRAVENOUS | Status: AC
  Administered 2015-03-29: 16 [IU] via INTRAVENOUS
  Filled 2015-03-29: qty 5.33

## 2015-03-29 MED ORDER — ZOLPIDEM TARTRATE 10 MG PO TABS: 10.0000 mg | ORAL_TABLET | Freq: Every evening | ORAL | Status: DC | PRN

## 2015-03-29 MED ORDER — LEUPROLIDE ACETATE 7.5 MG IM KIT
7.5000 mg | PACK | INTRAMUSCULAR | Status: DC
  Administered 2015-03-29: 7.5 mg via INTRAMUSCULAR
  Filled 2015-03-29: qty 7.5

## 2015-03-29 MED ORDER — VINBLASTINE SULFATE CHEMO INJECTION 1 MG/ML
6.3000 mg/m2 | Freq: Once | INTRAVENOUS | Status: AC
  Administered 2015-03-29: 10 mg via INTRAVENOUS
  Filled 2015-03-29: qty 10

## 2015-03-29 MED ORDER — SODIUM CHLORIDE 0.9 % IJ SOLN
10.0000 mL | INTRAMUSCULAR | Status: DC | PRN
  Administered 2015-03-29: 10 mL
  Filled 2015-03-29: qty 10

## 2015-03-29 MED ORDER — SODIUM CHLORIDE 0.9 % IV SOLN
375.0000 mg/m2 | Freq: Once | INTRAVENOUS | Status: AC
  Administered 2015-03-29: 600 mg via INTRAVENOUS
  Filled 2015-03-29: qty 30

## 2015-03-29 MED ORDER — SODIUM CHLORIDE 0.9 % IJ SOLN
10.0000 mL | INTRAMUSCULAR | Status: DC | PRN
  Administered 2015-03-29: 10 mL via INTRAVENOUS
  Filled 2015-03-29: qty 10

## 2015-03-29 MED ORDER — SODIUM CHLORIDE 0.9 % IV SOLN
Freq: Once | INTRAVENOUS | Status: AC
  Administered 2015-03-29: 13:00:00 via INTRAVENOUS

## 2015-03-29 MED ORDER — PALONOSETRON HCL INJECTION 0.25 MG/5ML
0.2500 mg | Freq: Once | INTRAVENOUS | Status: AC
Start: 2015-03-29 — End: 2015-03-29
  Administered 2015-03-29: 0.25 mg via INTRAVENOUS

## 2015-03-29 MED ORDER — HEPARIN SOD (PORK) LOCK FLUSH 100 UNIT/ML IV SOLN
500.0000 [IU] | Freq: Once | INTRAVENOUS | Status: AC | PRN
  Administered 2015-03-29: 500 [IU]
  Filled 2015-03-29: qty 5

## 2015-03-29 MED ORDER — ALTEPLASE 2 MG IJ SOLR
2.0000 mg | Freq: Once | INTRAMUSCULAR | Status: AC | PRN
  Administered 2015-03-29: 2 mg
  Filled 2015-03-29: qty 2

## 2015-03-29 MED ORDER — DOXORUBICIN HCL CHEMO IV INJECTION 2 MG/ML
25.0000 mg/m2 | Freq: Once | INTRAVENOUS | Status: AC
  Administered 2015-03-29: 40 mg via INTRAVENOUS
  Filled 2015-03-29: qty 20

## 2015-03-29 MED ORDER — SODIUM CHLORIDE 0.9 % IV SOLN
Freq: Once | INTRAVENOUS | Status: AC
  Administered 2015-03-29: 13:00:00 via INTRAVENOUS
  Filled 2015-03-29: qty 5

## 2015-03-29 NOTE — Assessment & Plan Note (Signed)
As mentioned above, I will recommend prophylactic Zofran for 2 days after each cycle. I will add Emend and Aloxi to future treatment.

## 2015-03-29 NOTE — Patient Instructions (Signed)
[facility] Discharge Instructions for Patients Receiving Chemotherapy  Today you received the following chemotherapy agents DTIC, Bleomycin, Dacarbaxine, Vinblastine  To help prevent nausea and vomiting after your treatment, we encourage you to take your nausea medication as needed   If you develop nausea and vomiting that is not controlled by your nausea medication, call the clinic.   BELOW ARE SYMPTOMS THAT SHOULD BE REPORTED IMMEDIATELY:  *FEVER GREATER THAN 100.5 F  *CHILLS WITH OR WITHOUT FEVER  NAUSEA AND VOMITING THAT IS NOT CONTROLLED WITH YOUR NAUSEA MEDICATION  *UNUSUAL SHORTNESS OF BREATH  *UNUSUAL BRUISING OR BLEEDING  TENDERNESS IN MOUTH AND THROAT WITH OR WITHOUT PRESENCE OF ULCERS  *URINARY PROBLEMS  *BOWEL PROBLEMS  UNUSUAL RASH Items with * indicate a potential emergency and should be followed up as soon as possible.  Feel free to call the clinic you have any questions or concerns. The clinic phone number is [phone].  Please show the [facility] at check-in to the Emergency Department and triage nurse.  Neutropenia Neutropenia is a condition that occurs when the level of a certain type of white blood cell (neutrophil) in your body becomes lower than normal. Neutrophils are made in the bone marrow and fight infections. These cells protect against bacteria and viruses. The fewer neutrophils you have, and the longer your body remains without them, the greater your risk of getting a severe infection becomes. CAUSES  The cause of neutropenia may be hard to determine. However, it is usually due to 3 main problems:   Decreased production of neutrophils. This may be due to:  Certain medicines such as chemotherapy.  Genetic problems.  Cancer.  Radiation treatments.  Vitamin deficiency.  Some pesticides.  Increased destruction of neutrophils. This may be due to:  Overwhelming infections.  Hemolytic anemia. This is when the  body destroys its own blood cells.  Chemotherapy.  Neutrophils moving to areas of the body where they cannot fight infections. This may be due to:  Dialysis procedures.  Conditions where the spleen becomes enlarged. Neutrophils are held in the spleen and are not available to the rest of the body.  Overwhelming infections. The neutrophils are held in the area of the infection and are not available to the rest of the body. SYMPTOMS  There are no specific symptoms of neutropenia. The lack of neutrophils can result in an infection, and an infection can cause various problems. DIAGNOSIS  Diagnosis is made by a blood test. A complete blood count is performed. The normal level of neutrophils in human blood differs with age and race. Infants have lower counts than older children and adults. African Americans have lower counts than Caucasians or Asians. The average adult level is 1500 cells/mm3 of blood. Neutrophil counts are interpreted as follows:  Greater than 1000 cells/mm3 gives normal protection against infection.  500 to 1000 cells/mm3 gives an increased risk for infection.  200 to 500 cells/mm3 is a greater risk for severe infection.  Lower than 200 cells/mm3 is a marked risk of infection. This may require hospitalization and treatment with antibiotic medicines. TREATMENT  Treatment depends on the underlying cause, severity, and presence of infections or symptoms. It also depends on your health. Your caregiver will discuss the treatment plan with you. Mild cases are often easily treated and have a good outcome. Preventative measures may also be started to limit your risk of infections. Treatment can include:  Taking antibiotics.  Stopping medicines that are known to cause neutropenia.  Correcting nutritional deficiencies by eating green vegetables to supply folic acid and taking vitamin B supplements.  Stopping exposure to pesticides if your neutropenia is related to pesticide  exposure.  Taking a blood growth factor called sargramostim, pegfilgrastim, or filgrastim if you are undergoing chemotherapy for cancer. This stimulates white blood cell production.  Removal of the spleen if you have Felty's syndrome and have repeated infections. HOME CARE INSTRUCTIONS   Follow your caregiver's instructions about when you need to have blood work done.  Wash your hands often. Make sure others who come in contact with you also wash their hands.  Wash raw fruits and vegetables before eating them. They can carry bacteria and fungi.  Avoid people with colds or spreadable (contagious) diseases (chickenpox, herpes zoster, influenza).  Avoid large crowds.  Avoid construction areas. The dust can release fungus into the air.  Be cautious around children in daycare or school environments.  Take care of your respiratory system by coughing and deep breathing.  Bathe daily.  Protect your skin from cuts and burns.  Do not work in the garden or with flowers and plants.  Care for the mouth before and after meals by brushing with a soft toothbrush. If you have mucositis, do not use mouthwash. Mouthwash contains alcohol and can dry out the mouth even more.  Clean the area between the genitals and the anus (perineal area) after urination and bowel movements. Women need to wipe from front to back.  Use a water soluble lubricant during sexual intercourse and practice good hygiene after. Do not have intercourse if you are severely neutropenic. Check with your caregiver for guidelines.  Exercise daily as tolerated.  Avoid people who were vaccinated with a live vaccine in the past 30 days. You should not receive live vaccines (polio, typhoid).  Do not provide direct care for pets. Avoid animal droppings. Do not clean litter boxes and bird cages.  Do not share food utensils.  Do not use tampons, enemas, or rectal suppositories unless directed by your caregiver.  Use an electric  razor to remove hair.  Wash your hands after handling magazines, letters, and newspapers. SEEK IMMEDIATE MEDICAL CARE IF:   You have a fever.  You have chills or start to shake.  You feel nauseous or vomit.  You develop mouth sores.  You develop aches and pains.  You have redness and swelling around open wounds.  Your skin is warm to the touch.  You have pus coming from your wounds.  You develop swollen lymph nodes.  You feel weak or fatigued.  You develop red streaks on the skin. MAKE SURE YOU:  Understand these instructions.  Will watch your condition.  Will get help right away if you are not doing well or get worse. Document Released: 02/27/2002 Document Revised: 11/30/2011 Document Reviewed: 03/27/2011 [facility] Patient Information 2015 [place], [place]. This information is not intended to replace advice given to you by your health care provider. Make sure you discuss any questions you have with your health care provider.

## 2015-03-29 NOTE — Assessment & Plan Note (Addendum)
This is likely due to recent treatment. The patient denies recent history of fevers, cough, chills, diarrhea or dysuria. She is asymptomatic from the leukopenia. I will observe for now.  I will continue the chemotherapy at current dose without dosage adjustment.  If the leukopenia gets progressive worse in the future, I might have to delay her treatment or adjust the chemotherapy dose. I recommend neutropenic precaution.

## 2015-03-29 NOTE — Patient Instructions (Signed)

## 2015-03-29 NOTE — Assessment & Plan Note (Signed)
She tolerated treatment well from mild nausea after treatment which subsequently resolved. I recommend she takes prophylactic Zofran for 2 days after chemotherapy to prevent delayed nausea. I will also change her antiemetics to add Aloxi and amend. I reviewed with her the most current NCCN guidelines. She is considered unfavorable stage IIA due to presence of B symptoms. Plan would be to do 2 cycles of ABVD before repeat staging PET CT scan.

## 2015-03-29 NOTE — Telephone Encounter (Signed)
per pof to sch pt appt-sent MW email to sch pt trmt-pt to get updated copy of sch b4 leaving trmt room today

## 2015-03-29 NOTE — Progress Notes (Signed)
Pt saw Dr. Alvy Bimler today prior to chemo.  Per md's orders, treat regardless of ANC.

## 2015-03-29 NOTE — Progress Notes (Signed)
Patient in for [facility] access and labs today. Patient also has appointment in Infusion today. PAC accessed and flushed without any difficulty. No blood return noted after flushing PAC. Patient instructed to raise both arms and lean forward but no blood return noted. Patient denies any pain or discomfort during or after PAC flush. PAC site covered with a Tegaderm dressing. Patient agreed to have all labs drawn peripherally today. All labs drawn by [place], Phlebotomist today. Infusion Room Charge Nurse Sharlynn Oliphant, RN and Tonye Royalty, RN, Desk Nurse for Dr. Alvy Bimler, made aware of no blood return from Patient's [facility]. Patient discharged from Flush Room without any complaints.

## 2015-03-29 NOTE — Telephone Encounter (Signed)
Per staff message and POF I have scheduled appts. Advised scheduler of appts. JMW  

## 2015-03-29 NOTE — Progress Notes (Signed)
[place] OFFICE PROGRESS NOTE  Patient Care Team: No Pcp Per Patient as PCP - General (General Practice) Izora Gala, MD as Consulting Physician (Otolaryngology)  SUMMARY OF ONCOLOGIC HISTORY:   Hodgkin lymphoma   02/08/2015 Surgery she underwent excisional lymph node biopsy of the left supraclavicular mass   02/08/2015 Pathology Results Accession: [id number] Biopsy showed Classical Hodgkin Lymphoma   02/22/2015 Imaging ECHO is normal   02/25/2015 Procedure She has port placement   02/26/2015 Miscellaneous PFT is normal   02/28/2015 Bone Marrow Biopsy Accession: [id number] BM biopsy was negative   03/01/2015 -  Chemotherapy She received ABVD    INTERVAL HISTORY: Please see below for problem oriented charting. She is seen prior to cycle 2 day 1 treatment. She has significant nausea with her prior treatment. No recent vomiting. Denies recent infection. No new lymphadenopathy.  REVIEW OF SYSTEMS:   Constitutional: Denies fevers, chills or abnormal weight loss Eyes: Denies blurriness of vision Ears, nose, mouth, throat, and face: Denies mucositis or sore throat Respiratory: Denies cough, dyspnea or wheezes Cardiovascular: Denies palpitation, chest discomfort or lower extremity swelling Skin: Denies abnormal skin rashes Lymphatics: Denies new lymphadenopathy or easy bruising Neurological:Denies numbness, tingling or new weaknesses Behavioral/Psych: Mood is stable, no new changes  All other systems were reviewed with the patient and are negative.  I have reviewed the past medical history, past surgical history, social history and family history with the patient and they are unchanged from previous note.  ALLERGIES:  has No Known Allergies.  MEDICATIONS:  Current Outpatient Prescriptions  Medication Sig Dispense Refill  . etonogestrel-ethinyl estradiol (NUVARING) 0.12-0.015 MG/24HR vaginal ring Place 1 each vaginally every 28 (twenty-eight) days. Insert vaginally and leave  in place for 3 consecutive weeks, then remove for 1 week.    . Fiber POWD Take 4 g by mouth 2 (two) times daily.    Marland Kitchen HYDROcodone-acetaminophen (NORCO) 7.5-325 MG per tablet Take 1 tablet by mouth every 6 (six) hours as needed for moderate pain. 30 tablet 0  . lidocaine-prilocaine (EMLA) cream Apply to affected area once 30 g 3  . NON FORMULARY Take 1 tablet by mouth 2 (two) times daily. Balance health omega    . NON FORMULARY Take 3 tablets by mouth 2 (two) times daily. Nutralite doubleX multivitamin pack    . NON FORMULARY Take 1 tablet by mouth 2 (two) times daily. Garlic-665m, peppermint leaf- 363m [id number] garlic heart care tab    . NON FORMULARY Take 1 tablet by mouth 2 (two) times daily. Joint health + vit C    . ondansetron (ZOFRAN) 8 MG tablet Take 1 tablet (8 mg total) by mouth every 8 (eight) hours as needed for nausea or vomiting. 90 tablet 3  . promethazine (PHENERGAN) 25 MG tablet Take 1 tablet (25 mg total) by mouth every 6 (six) hours as needed for nausea or vomiting. 30 tablet 0  . zolpidem (AMBIEN) 10 MG tablet Take 1 tablet (10 mg total) by mouth at bedtime as needed for sleep. 30 tablet 0   No current facility-administered medications for this visit.   Facility-Administered Medications Ordered in Other Visits  Medication Dose Route Frequency Provider Last Rate Last Dose  . heparin lock flush 100 unit/mL  500 Units Intracatheter Once PRN NiHeath LarkMD      . leuprolide (LUPRON) injection 7.5 mg  7.5 mg Intramuscular Q28 days NiHeath LarkMD      . sodium chloride 0.9 % injection 10 mL  10 mL Intracatheter PRN Heath Lark, MD        PHYSICAL EXAMINATION: ECOG PERFORMANCE STATUS: 1 - Symptomatic but completely ambulatory  Filed Vitals:   03/29/15 1137  BP: 111/63  Pulse: 77  Temp: 98 F (36.7 C)  Resp: 18   Filed Weights   03/29/15 1137  Weight: 134 lb 4.8 oz (60.918 kg)    GENERAL:alert, no distress and comfortable SKIN: skin color, texture,  turgor are normal, no rashes or significant lesions EYES: normal, Conjunctiva are pink and non-injected, sclera clear OROPHARYNX:no exudate, no erythema and lips, buccal mucosa, and tongue normal  NECK: supple, thyroid normal size, non-tender, without nodularity LYMPH:  Persistent palpable lymphadenopathy in the left supraclavicular region, likely related to mild seroma and surgical scarring.  LUNGS: clear to auscultation and percussion with normal breathing effort HEART: regular rate & rhythm and no murmurs and no lower extremity edema ABDOMEN:abdomen soft, non-tender and normal bowel sounds Musculoskeletal:no cyanosis of digits and no clubbing  NEURO: alert & oriented x 3 with fluent speech, no focal motor/sensory deficits  LABORATORY DATA:  I have reviewed the data as listed    Component Value Date/Time   NA 140 03/29/2015 1106   NA 139 02/28/2015 0740   K 4.1 03/29/2015 1106   K 3.4* 02/28/2015 0740   CL 106 02/28/2015 0740   CO2 21* 03/29/2015 1106   CO2 22 02/28/2015 0740   GLUCOSE 70 03/29/2015 1106   GLUCOSE 98 02/28/2015 0740   BUN 10.4 03/29/2015 1106   BUN 13 02/28/2015 0740   CREATININE 0.7 03/29/2015 1106   CREATININE 0.66 02/28/2015 0740   CALCIUM 8.6 03/29/2015 1106   CALCIUM 8.9 02/28/2015 0740   PROT 6.1* 03/29/2015 1106   PROT 7.4 02/28/2015 0740   ALBUMIN 3.3* 03/29/2015 1106   ALBUMIN 3.5 02/28/2015 0740   AST 15 03/29/2015 1106   AST 22 02/28/2015 0740   ALT 10 03/29/2015 1106   ALT 16 02/28/2015 0740   ALKPHOS 41 03/29/2015 1106   ALKPHOS 69 02/28/2015 0740   BILITOT 0.45 03/29/2015 1106   BILITOT 0.6 02/28/2015 0740   GFRNONAA >60 02/28/2015 0740   GFRAA >60 02/28/2015 0740    No results found for: SPEP, UPEP  Lab Results  Component Value Date   WBC 2.9* 03/29/2015   NEUTROABS 0.9* 03/29/2015   HGB 11.6 03/29/2015   HCT 34.8 03/29/2015   MCV 93.3 03/29/2015   PLT 221 03/29/2015      Chemistry      Component Value Date/Time   NA 140  03/29/2015 1106   NA 139 02/28/2015 0740   K 4.1 03/29/2015 1106   K 3.4* 02/28/2015 0740   CL 106 02/28/2015 0740   CO2 21* 03/29/2015 1106   CO2 22 02/28/2015 0740   BUN 10.4 03/29/2015 1106   BUN 13 02/28/2015 0740   CREATININE 0.7 03/29/2015 1106   CREATININE 0.66 02/28/2015 0740      Component Value Date/Time   CALCIUM 8.6 03/29/2015 1106   CALCIUM 8.9 02/28/2015 0740   ALKPHOS 41 03/29/2015 1106   ALKPHOS 69 02/28/2015 0740   AST 15 03/29/2015 1106   AST 22 02/28/2015 0740   ALT 10 03/29/2015 1106   ALT 16 02/28/2015 0740   BILITOT 0.45 03/29/2015 1106   BILITOT 0.6 02/28/2015 0740       ASSESSMENT & PLAN:  Hodgkin lymphoma She tolerated treatment well from mild nausea after treatment which subsequently resolved. I recommend she takes prophylactic Zofran for  2 days after chemotherapy to prevent delayed nausea. I will also change her antiemetics to add Aloxi and amend. I reviewed with her the most current NCCN guidelines. She is considered unfavorable stage IIA due to presence of B symptoms. Plan would be to do 2 cycles of ABVD before repeat staging PET CT scan.  At risk for fertility problems We discussed fertility preservation. I will order Lupron every 4 weeks   Chemotherapy-induced nausea As mentioned above, I will recommend prophylactic Zofran for 2 days after each cycle. I will add Emend and Aloxi to future treatment.    Insomnia This is likely related to treatment. I recommend a trial of Ambien.  Leukopenia due to antineoplastic chemotherapy This is likely due to recent treatment. The patient denies recent history of fevers, cough, chills, diarrhea or dysuria. She is asymptomatic from the leukopenia. I will observe for now.  I will continue the chemotherapy at current dose without dosage adjustment.  If the leukopenia gets progressive worse in the future, I might have to delay her treatment or adjust the chemotherapy dose. I recommend neutropenic  precaution.   Orders Placed This Encounter  Procedures  . NM PET Image Restag (PS) Skull Base To Thigh    Standing Status: Future     Number of Occurrences:      Standing Expiration Date: 05/28/2016    Order Specific Question:  Reason for Exam (SYMPTOM  OR DIAGNOSIS REQUIRED)    Answer:  staging lymphoma, assess response to Rx    Order Specific Question:  Is the patient pregnant?    Answer:  No    Order Specific Question:  Preferred imaging location?    Answer:  [facility]   All questions were answered. The patient knows to call the clinic with any problems, questions or concerns. No barriers to learning was detected. I spent 25 minutes counseling the patient face to face. The total time spent in the appointment was 40 minutes and more than 50% was on counseling and review of test results     [facility], Jakwon Gayton, MD 03/29/2015 4:16 PM

## 2015-03-29 NOTE — Assessment & Plan Note (Signed)
We discussed fertility preservation. I will order Lupron every 4 weeks

## 2015-03-29 NOTE — Assessment & Plan Note (Signed)
This is likely related to treatment. I recommend a trial of Ambien.

## 2015-04-01 ENCOUNTER — Telehealth: Payer: Self-pay | Admitting: *Deleted

## 2015-04-01 NOTE — Telephone Encounter (Signed)
-----   Message from Heath Lark, MD sent at 04/01/2015  8:22 AM EDT ----- Regarding: nausea When you have a chance, can you call and check on her? I switched her anti-emetics recently due to severe nausea

## 2015-04-01 NOTE — Telephone Encounter (Signed)
LVM for pt to return nurse's call and let us know how she is doing.

## 2015-04-12 ENCOUNTER — Ambulatory Visit: Payer: BLUE CROSS/BLUE SHIELD

## 2015-04-12 ENCOUNTER — Other Ambulatory Visit (HOSPITAL_BASED_OUTPATIENT_CLINIC_OR_DEPARTMENT_OTHER): Payer: BLUE CROSS/BLUE SHIELD

## 2015-04-12 ENCOUNTER — Other Ambulatory Visit (HOSPITAL_COMMUNITY)
Admission: RE | Admit: 2015-04-12 | Discharge: 2015-04-12 | Disposition: A | Discharge status: Home / Self Care | Payer: BLUE CROSS/BLUE SHIELD | Source: Ambulatory Visit | Attending: Hematology and Oncology | Admitting: Hematology and Oncology

## 2015-04-12 ENCOUNTER — Ambulatory Visit (HOSPITAL_BASED_OUTPATIENT_CLINIC_OR_DEPARTMENT_OTHER): Payer: BLUE CROSS/BLUE SHIELD

## 2015-04-12 DIAGNOSIS — C8190 Hodgkin lymphoma, unspecified, unspecified site: Secondary | ICD-10-CM | POA: Diagnosis not present

## 2015-04-12 DIAGNOSIS — Z95828 Presence of other vascular implants and grafts: Secondary | ICD-10-CM

## 2015-04-12 DIAGNOSIS — Z5111 Encounter for antineoplastic chemotherapy: Secondary | ICD-10-CM

## 2015-04-12 LAB — COMPREHENSIVE METABOLIC PANEL (CC13)
ALBUMIN: 3.4 g/dL — AB (ref 3.5–5.0)
ALK PHOS: 54 U/L (ref 40–150)
ALT: 14 U/L (ref 0–55)
AST: 20 U/L (ref 5–34)
Anion Gap: 11 mEq/L (ref 3–11)
BILIRUBIN TOTAL: 0.64 mg/dL (ref 0.20–1.20)
BUN: 12.5 mg/dL (ref 7.0–26.0)
CO2: 22 mEq/L (ref 22–29)
Calcium: 8.7 mg/dL (ref 8.4–10.4)
Chloride: 110 mEq/L — ABNORMAL HIGH (ref 98–109)
Creatinine: 0.7 mg/dL (ref 0.6–1.1)
GLUCOSE: 120 mg/dL (ref 70–140)
Potassium: 3.7 mEq/L (ref 3.5–5.1)
Sodium: 143 mEq/L (ref 136–145)
Total Protein: 6.3 g/dL — ABNORMAL LOW (ref 6.4–8.3)

## 2015-04-12 LAB — CBC WITH DIFFERENTIAL/PLATELET
BASO%: 0.8 % (ref 0.0–2.0)
Basophils Absolute: 0 10*3/uL (ref 0.0–0.1)
EOS%: 4.3 % (ref 0.0–7.0)
Eosinophils Absolute: 0.1 10*3/uL (ref 0.0–0.5)
HCT: 35.3 % (ref 34.8–46.6)
HGB: 12 g/dL (ref 11.6–15.9)
LYMPH#: 1.4 10*3/uL (ref 0.9–3.3)
LYMPH%: 54.5 % — ABNORMAL HIGH (ref 14.0–49.7)
MCH: 31.5 pg (ref 25.1–34.0)
MCHC: 34 g/dL (ref 31.5–36.0)
MCV: 92.7 fL (ref 79.5–101.0)
MONO#: 0.3 10*3/uL (ref 0.1–0.9)
MONO%: 10.3 % (ref 0.0–14.0)
NEUT%: 30.1 % — AB (ref 38.4–76.8)
NEUTROS ABS: 0.8 10*3/uL — AB (ref 1.5–6.5)
PLATELETS: 237 10*3/uL (ref 145–400)
RBC: 3.81 10*6/uL (ref 3.70–5.45)
RDW: 14.8 % — ABNORMAL HIGH (ref 11.2–14.5)
WBC: 2.5 10*3/uL — ABNORMAL LOW (ref 3.9–10.3)

## 2015-04-12 LAB — PREGNANCY, URINE: Preg Test, Ur: NEGATIVE

## 2015-04-12 MED ORDER — FOSAPREPITANT DIMEGLUMINE INJECTION 150 MG
Freq: Once | INTRAVENOUS | Status: AC
  Administered 2015-04-12: 12:00:00 via INTRAVENOUS
  Filled 2015-04-12: qty 5

## 2015-04-12 MED ORDER — SODIUM CHLORIDE 0.9 % IV SOLN
10.0000 [IU]/m2 | Freq: Once | INTRAVENOUS | Status: AC
  Administered 2015-04-12: 16 [IU] via INTRAVENOUS
  Filled 2015-04-12: qty 5.33

## 2015-04-12 MED ORDER — PALONOSETRON HCL INJECTION 0.25 MG/5ML
INTRAVENOUS | Status: AC
  Filled 2015-04-12: qty 5

## 2015-04-12 MED ORDER — SODIUM CHLORIDE 0.9 % IV SOLN
6.3000 mg/m2 | Freq: Once | INTRAVENOUS | Status: AC
  Administered 2015-04-12: 10 mg via INTRAVENOUS
  Filled 2015-04-12: qty 10

## 2015-04-12 MED ORDER — SODIUM CHLORIDE 0.9 % IV SOLN
Freq: Once | INTRAVENOUS | Status: AC
  Administered 2015-04-12: 12:00:00 via INTRAVENOUS

## 2015-04-12 MED ORDER — SODIUM CHLORIDE 0.9 % IV SOLN
375.0000 mg/m2 | Freq: Once | INTRAVENOUS | Status: AC
  Administered 2015-04-12: 600 mg via INTRAVENOUS
  Filled 2015-04-12: qty 30

## 2015-04-12 MED ORDER — PALONOSETRON HCL INJECTION 0.25 MG/5ML
0.2500 mg | Freq: Once | INTRAVENOUS | Status: AC
  Administered 2015-04-12: 0.25 mg via INTRAVENOUS

## 2015-04-12 MED ORDER — DOXORUBICIN HCL CHEMO IV INJECTION 2 MG/ML
25.0000 mg/m2 | Freq: Once | INTRAVENOUS | Status: AC
  Administered 2015-04-12: 40 mg via INTRAVENOUS
  Filled 2015-04-12: qty 20

## 2015-04-12 MED ORDER — HEPARIN SOD (PORK) LOCK FLUSH 100 UNIT/ML IV SOLN
500.0000 [IU] | Freq: Once | INTRAVENOUS | Status: AC | PRN
  Administered 2015-04-12: 500 [IU]
  Filled 2015-04-12: qty 5

## 2015-04-12 MED ORDER — SODIUM CHLORIDE 0.9 % IJ SOLN
10.0000 mL | INTRAMUSCULAR | Status: DC | PRN
  Administered 2015-04-12: 10 mL
  Filled 2015-04-12: qty 10

## 2015-04-12 MED ORDER — SODIUM CHLORIDE 0.9 % IJ SOLN
10.0000 mL | INTRAMUSCULAR | Status: DC | PRN
  Administered 2015-04-12: 10 mL via INTRAVENOUS
  Filled 2015-04-12: qty 10

## 2015-04-12 NOTE — Patient Instructions (Signed)

## 2015-04-12 NOTE — Progress Notes (Signed)
Per dr Alen Blew, okay to treat today despite counts. Patient given neutropenic precautions, knows to call this office for signs and symptoms of infection.

## 2015-04-12 NOTE — Patient Instructions (Signed)
Vinblastine injection What is this medicine? VINBLASTINE (vin BLAS teen) is a chemotherapy drug. It slows the growth of cancer cells. This medicine is used to treat many types of cancer like breast cancer, testicular cancer, Hodgkin's disease, non-Hodgkin's lymphoma, and sarcoma. This medicine may be used for other purposes; ask your health care provider or pharmacist if you have questions. COMMON BRAND NAME(S): Velban What should I tell my health care provider before I take this medicine? They need to know if you have any of these conditions: -blood disorders -dental disease -gout -infection (especially a virus infection such as chickenpox, cold sores, or herpes) -liver disease -lung disease -nervous system disease -recent or ongoing radiation therapy -an unusual or allergic reaction to vinblastine, other chemotherapy agents, other medicines, foods, dyes, or preservatives -pregnant or trying to get pregnant -breast-feeding How should I use this medicine? This drug is given as an infusion into a vein. It is administered in a hospital or clinic by a specially trained health care professional. If you have pain, swelling, burning or any unusual feeling around the site of your injection, tell your health care professional right away. Talk to your pediatrician regarding the use of this medicine in children. While this drug may be prescribed for selected conditions, precautions do apply. Overdosage: If you think you have taken too much of this medicine contact a poison control center or emergency room at once. NOTE: This medicine is only for you. Do not share this medicine with others. What if I miss a dose? It is important not to miss your dose. Call your doctor or health care professional if you are unable to keep an appointment. What may interact with this medicine? Do not take this medicine with any of the following medications: -erythromycin -itraconazole -mibefradil -voriconazole This  medicine may also interact with the following medications: -cyclosporine -fluconazole -ketoconazole -medicines for seizures like phenytoin -medicines to increase blood counts like filgrastim, pegfilgrastim, sargramostim -vaccines -verapamil Talk to your doctor or health care professional before taking any of these medicines: -acetaminophen -aspirin -ibuprofen -ketoprofen -naproxen This list may not describe all possible interactions. Give your health care provider a list of all the medicines, herbs, non-prescription drugs, or dietary supplements you use. Also tell them if you smoke, drink alcohol, or use illegal drugs. Some items may interact with your medicine. What should I watch for while using this medicine? Your condition will be monitored carefully while you are receiving this medicine. You will need important blood work done while you are taking this medicine. This drug may make you feel generally unwell. This is not uncommon, as chemotherapy can affect healthy cells as well as cancer cells. Report any side effects. Continue your course of treatment even though you feel ill unless your doctor tells you to stop. In some cases, you may be given additional medicines to help with side effects. Follow all directions for their use. Call your doctor or health care professional for advice if you get a fever, chills or sore throat, or other symptoms of a cold or flu. Do not treat yourself. This drug decreases your body's ability to fight infections. Try to avoid being around people who are sick. This medicine may increase your risk to bruise or bleed. Call your doctor or health care professional if you notice any unusual bleeding. Be careful brushing and flossing your teeth or using a toothpick because you may get an infection or bleed more easily. If you have any dental work done, tell your dentist you  are receiving this medicine. Avoid taking products that contain aspirin, acetaminophen,  ibuprofen, naproxen, or ketoprofen unless instructed by your doctor. These medicines may hide a fever. Do not become pregnant while taking this medicine. Women should inform their doctor if they wish to become pregnant or think they might be pregnant. There is a potential for serious side effects to an unborn child. Talk to your health care professional or pharmacist for more information. Do not breast-feed an infant while taking this medicine. Men may have a lower sperm count while taking this medicine. Talk to your doctor if you plan to father a child. What side effects may I notice from receiving this medicine? Side effects that you should report to your doctor or health care professional as soon as possible: -allergic reactions like skin rash, itching or hives, swelling of the face, lips, or tongue -low blood counts - This drug may decrease the number of white blood cells, red blood cells and platelets. You may be at increased risk for infections and bleeding. -signs of infection - fever or chills, cough, sore throat, pain or difficulty passing urine -signs of decreased platelets or bleeding - bruising, pinpoint red spots on the skin, black, tarry stools, nosebleeds -signs of decreased red blood cells - unusually weak or tired, fainting spells, lightheadedness -breathing problems -changes in hearing -change in the amount of urine -chest pain -high blood pressure -mouth sores -nausea and vomiting -pain, swelling, redness or irritation at the injection site -pain, tingling, numbness in the hands or feet -problems with balance, dizziness -seizures Side effects that usually do not require medical attention (report to your doctor or health care professional if they continue or are bothersome): -constipation -hair loss -jaw pain -loss of appetite -sensitivity to light -stomach pain -tumor pain This list may not describe all possible side effects. Call your doctor for medical advice about  side effects. You may report side effects to FDA at 1-800-FDA-1088. Where should I keep my medicine? This drug is given in a hospital or clinic and will not be stored at home. NOTE: This sheet is a summary. It may not cover all possible information. If you have questions about this medicine, talk to your doctor, pharmacist, or health care provider.  2015, [employer]. (2008-06-04 17:15:59) Bleomycin injection What is this medicine? BLEOMYCIN (blee oh MYE sin) is a chemotherapy drug. It is used to treat many kinds of cancer like lymphoma, cervical cancer, head and neck cancer, and testicular cancer. It is also used to prevent and to treat fluid build-up around the lungs caused by some cancers. This medicine may be used for other purposes; ask your health care provider or pharmacist if you have questions. COMMON BRAND NAME(S): Blenoxane What should I tell my health care provider before I take this medicine? They need to know if you have any of these conditions: -cigarette smoker -kidney disease -lung disease -recent or ongoing radiation therapy -an unusual or allergic reaction to bleomycin, other chemotherapy agents, other medicines, foods, dyes, or preservatives -pregnant or trying to get pregnant -breast-feeding How should I use this medicine? This drug is given as an infusion into a vein or a body cavity. It can also be given as an injection into a muscle or under the skin. It is administered in a hospital or clinic by a specially trained health care professional. Talk to your pediatrician regarding the use of this medicine in children. Special care may be needed. Overdosage: If you think you have taken too much  of this medicine contact a poison control center or emergency room at once. NOTE: This medicine is only for you. Do not share this medicine with others. What if I miss a dose? It is important not to miss your dose. Call your doctor or health care professional if you are  unable to keep an appointment. What may interact with this medicine? -certain antibiotics given by injection -cisplatin -cyclosporine -diuretics -foscarnet -medicines to increase blood counts like filgrastim, pegfilgrastim, sargramostim -vaccines This list may not describe all possible interactions. Give your health care provider a list of all the medicines, herbs, non-prescription drugs, or dietary supplements you use. Also tell them if you smoke, drink alcohol, or use illegal drugs. Some items may interact with your medicine. What should I watch for while using this medicine? Visit your doctor for checks on your progress. This drug may make you feel generally unwell. This is not uncommon, as chemotherapy can affect healthy cells as well as cancer cells. Report any side effects. Continue your course of treatment even though you feel ill unless your doctor tells you to stop. Call your doctor or health care professional for advice if you get a fever, chills or sore throat, or other symptoms of a cold or flu. Do not treat yourself. This drug decreases your body's ability to fight infections. Try to avoid being around people who are sick. Avoid taking products that contain aspirin, acetaminophen, ibuprofen, naproxen, or ketoprofen unless instructed by your doctor. These medicines may hide a fever. Do not become pregnant while taking this medicine. Women should inform their doctor if they wish to become pregnant or think they might be pregnant. There is a potential for serious side effects to an unborn child. Talk to your health care professional or pharmacist for more information. Do not breast-feed an infant while taking this medicine. There is a maximum amount of this medicine you should receive throughout your life. The amount depends on the medical condition being treated and your overall health. Your doctor will watch how much of this medicine you receive in your lifetime. Tell your doctor if you  have taken this medicine before. What side effects may I notice from receiving this medicine? Side effects that you should report to your doctor or health care professional as soon as possible: -allergic reactions like skin rash, itching or hives, swelling of the face, lips, or tongue -breathing problems -chest pain -confusion -cough -fast, irregular heartbeat -feeling faint or lightheaded, falls -fever or chills -mouth sores -pain, tingling, numbness in the hands or feet -trouble passing urine or change in the amount of urine -yellowing of the eyes or skin Side effects that usually do not require medical attention (report to your doctor or health care professional if they continue or are bothersome): -darker skin color -hair loss -irritation at site where injected -loss of appetite -nail changes -nausea and vomiting -weight loss This list may not describe all possible side effects. Call your doctor for medical advice about side effects. You may report side effects to FDA at 1-800-FDA-1088. Where should I keep my medicine? This drug is given in a hospital or clinic and will not be stored at home. NOTE: This sheet is a summary. It may not cover all possible information. If you have questions about this medicine, talk to your doctor, pharmacist, or health care provider.  2015, [employer]. (2013-01-03 09:36:48) Doxorubicin injection What is this medicine? DOXORUBICIN (dox oh ROO bi sin) is a chemotherapy drug. It is used to  treat many kinds of cancer like Hodgkin's disease, leukemia, non-Hodgkin's lymphoma, neuroblastoma, sarcoma, and Wilms' tumor. It is also used to treat bladder cancer, breast cancer, lung cancer, ovarian cancer, stomach cancer, and thyroid cancer. This medicine may be used for other purposes; ask your health care provider or pharmacist if you have questions. COMMON BRAND NAME(S): Adriamycin, Adriamycin PFS, Adriamycin RDF, Rubex What should I tell my  health care provider before I take this medicine? They need to know if you have any of these conditions: -blood disorders -heart disease, recent heart attack -infection (especially a virus infection such as chickenpox, cold sores, or herpes) -irregular heartbeat -liver disease -recent or ongoing radiation therapy -an unusual or allergic reaction to doxorubicin, other chemotherapy agents, other medicines, foods, dyes, or preservatives -pregnant or trying to get pregnant -breast-feeding How should I use this medicine? This drug is given as an infusion into a vein. It is administered in a hospital or clinic by a specially trained health care professional. If you have pain, swelling, burning or any unusual feeling around the site of your injection, tell your health care professional right away. Talk to your pediatrician regarding the use of this medicine in children. Special care may be needed. Overdosage: If you think you have taken too much of this medicine contact a poison control center or emergency room at once. NOTE: This medicine is only for you. Do not share this medicine with others. What if I miss a dose? It is important not to miss your dose. Call your doctor or health care professional if you are unable to keep an appointment. What may interact with this medicine? Do not take this medicine with any of the following medications: -cisapride -droperidol -halofantrine -pimozide -zidovudine This medicine may also interact with the following medications: -chloroquine -chlorpromazine -clarithromycin -cyclophosphamide -cyclosporine -erythromycin -medicines for depression, anxiety, or psychotic disturbances -medicines for irregular heart beat like amiodarone, bepridil, dofetilide, encainide, flecainide, propafenone, quinidine -medicines for seizures like ethotoin, fosphenytoin, phenytoin -medicines for nausea, vomiting like dolasetron, ondansetron, palonosetron -medicines to  increase blood counts like filgrastim, pegfilgrastim, sargramostim -methadone -methotrexate -pentamidine -progesterone -vaccines -verapamil Talk to your doctor or health care professional before taking any of these medicines: -acetaminophen -aspirin -ibuprofen -ketoprofen -naproxen This list may not describe all possible interactions. Give your health care provider a list of all the medicines, herbs, non-prescription drugs, or dietary supplements you use. Also tell them if you smoke, drink alcohol, or use illegal drugs. Some items may interact with your medicine. What should I watch for while using this medicine? Your condition will be monitored carefully while you are receiving this medicine. You will need important blood work done while you are taking this medicine. This drug may make you feel generally unwell. This is not uncommon, as chemotherapy can affect healthy cells as well as cancer cells. Report any side effects. Continue your course of treatment even though you feel ill unless your doctor tells you to stop. Your urine may turn red for a few days after your dose. This is not blood. If your urine is dark or brown, call your doctor. In some cases, you may be given additional medicines to help with side effects. Follow all directions for their use. Call your doctor or health care professional for advice if you get a fever, chills or sore throat, or other symptoms of a cold or flu. Do not treat yourself. This drug decreases your body's ability to fight infections. Try to avoid being around people who are sick.  This medicine may increase your risk to bruise or bleed. Call your doctor or health care professional if you notice any unusual bleeding. Be careful brushing and flossing your teeth or using a toothpick because you may get an infection or bleed more easily. If you have any dental work done, tell your dentist you are receiving this medicine. Avoid taking products that contain  aspirin, acetaminophen, ibuprofen, naproxen, or ketoprofen unless instructed by your doctor. These medicines may hide a fever. Men and women of childbearing age should use effective birth control methods while using taking this medicine. Do not become pregnant while taking this medicine. There is a potential for serious side effects to an unborn child. Talk to your health care professional or pharmacist for more information. Do not breast-feed an infant while taking this medicine. Do not let others touch your urine or other body fluids for 5 days after each treatment with this medicine. Caregivers should wear latex gloves to avoid touching body fluids during this time. There is a maximum amount of this medicine you should receive throughout your life. The amount depends on the medical condition being treated and your overall health. Your doctor will watch how much of this medicine you receive in your lifetime. Tell your doctor if you have taken this medicine before. What side effects may I notice from receiving this medicine? Side effects that you should report to your doctor or health care professional as soon as possible: -allergic reactions like skin rash, itching or hives, swelling of the face, lips, or tongue -low blood counts - this medicine may decrease the number of white blood cells, red blood cells and platelets. You may be at increased risk for infections and bleeding. -signs of infection - fever or chills, cough, sore throat, pain or difficulty passing urine -signs of decreased platelets or bleeding - bruising, pinpoint red spots on the skin, black, tarry stools, blood in the urine -signs of decreased red blood cells - unusually weak or tired, fainting spells, lightheadedness -breathing problems -chest pain -fast, irregular heartbeat -mouth sores -nausea, vomiting -pain, swelling, redness at site where injected -pain, tingling, numbness in the hands or feet -swelling of ankles, feet, or  hands -unusual bleeding or bruising Side effects that usually do not require medical attention (report to your doctor or health care professional if they continue or are bothersome): -diarrhea -facial flushing -hair loss -loss of appetite -missed menstrual periods -nail discoloration or damage -red or watery eyes -red colored urine -stomach upset This list may not describe all possible side effects. Call your doctor for medical advice about side effects. You may report side effects to FDA at 1-800-FDA-1088. Where should I keep my medicine? This drug is given in a hospital or clinic and will not be stored at home. NOTE: This sheet is a summary. It may not cover all possible information. If you have questions about this medicine, talk to your doctor, pharmacist, or health care provider.  2015, [employer]. (2013-01-03 09:54:34) Dacarbazine, DTIC injection What is this medicine? DACARBAZINE (da KAR ba zeen) is a chemotherapy drug. This medicine is used to treat skin cancer. It is also used with other medicines to treat Hodgkin's disease. This medicine may be used for other purposes; ask your health care provider or pharmacist if you have questions. COMMON BRAND NAME(S): DTIC-Dome What should I tell my health care provider before I take this medicine? They need to know if you have any of these conditions: -infection (especially virus infection such as  chickenpox, cold sores, or herpes) -kidney disease -liver disease -low blood counts like low platelets, red blood cells, white blood cells -recent radiation therapy -an unusual or allergic reaction to dacarbazine, other chemotherapy agents, other medicines, foods, dyes, or preservatives -pregnant or trying to get pregnant -breast-feeding How should I use this medicine? This drug is given as an injection or infusion into a vein. It is administered in a hospital or clinic by a specially trained health care professional. Talk to  your pediatrician regarding the use of this medicine in children. While this drug may be prescribed for selected conditions, precautions do apply. Overdosage: If you think you have taken too much of this medicine contact a poison control center or emergency room at once. NOTE: This medicine is only for you. Do not share this medicine with others. What if I miss a dose? It is important not to miss your dose. Call your doctor or health care professional if you are unable to keep an appointment. What may interact with this medicine? -medicines to increase blood counts like filgrastim, pegfilgrastim, sargramostim -vaccines This list may not describe all possible interactions. Give your health care provider a list of all the medicines, herbs, non-prescription drugs, or dietary supplements you use. Also tell them if you smoke, drink alcohol, or use illegal drugs. Some items may interact with your medicine. What should I watch for while using this medicine? Your condition will be monitored carefully while you are receiving this medicine. You will need important blood work done while you are taking this medicine. This drug may make you feel generally unwell. This is not uncommon, as chemotherapy can affect healthy cells as well as cancer cells. Report any side effects. Continue your course of treatment even though you feel ill unless your doctor tells you to stop. Call your doctor or health care professional for advice if you get a fever, chills or sore throat, or other symptoms of a cold or flu. Do not treat yourself. This drug decreases your body's ability to fight infections. Try to avoid being around people who are sick. This medicine may increase your risk to bruise or bleed. Call your doctor or health care professional if you notice any unusual bleeding. Be careful brushing and flossing your teeth or using a toothpick because you may get an infection or bleed more easily. If you have any dental work  done, tell your dentist you are receiving this medicine. Avoid taking products that contain aspirin, acetaminophen, ibuprofen, naproxen, or ketoprofen unless instructed by your doctor. These medicines may hide a fever. Do not become pregnant while taking this medicine. Women should inform their doctor if they wish to become pregnant or think they might be pregnant. There is a potential for serious side effects to an unborn child. Talk to your health care professional or pharmacist for more information. Do not breast-feed an infant while taking this medicine. What side effects may I notice from receiving this medicine? Side effects that you should report to your doctor or health care professional as soon as possible: -allergic reactions like skin rash, itching or hives, swelling of the face, lips, or tongue -low blood counts - this medicine may decrease the number of white blood cells, red blood cells and platelets. You may be at increased risk for infections and bleeding. -signs of infection - fever or chills, cough, sore throat, pain or difficulty passing urine -signs of decreased platelets or bleeding - bruising, pinpoint red spots on the skin, black, tarry stools,  blood in the urine -signs of decreased red blood cells - unusually weak or tired, fainting spells, lightheadedness -breathing problems -muscle pains -pain at site where injected -trouble passing urine or change in the amount of urine -vomiting -yellowing of the eyes or skin Side effects that usually do not require medical attention (report to your doctor or health care professional if they continue or are bothersome): -diarrhea -hair loss -loss of appetite -nausea -skin more sensitive to sun or ultraviolet light -stomach upset This list may not describe all possible side effects. Call your doctor for medical advice about side effects. You may report side effects to FDA at 1-800-FDA-1088. Where should I keep my medicine? This  drug is given in a hospital or clinic and will not be stored at home. NOTE: This sheet is a summary. It may not cover all possible information. If you have questions about this medicine, talk to your doctor, pharmacist, or health care provider.  2015, [employer]. (2007-12-27 16:56:39)

## 2015-04-25 ENCOUNTER — Ambulatory Visit (HOSPITAL_COMMUNITY)
Admission: RE | Admit: 2015-04-25 | Discharge: 2015-04-25 | Disposition: A | Discharge status: Home / Self Care | Payer: BLUE CROSS/BLUE SHIELD | Source: Ambulatory Visit | Attending: Hematology and Oncology | Admitting: Hematology and Oncology

## 2015-04-25 DIAGNOSIS — C8190 Hodgkin lymphoma, unspecified, unspecified site: Secondary | ICD-10-CM | POA: Insufficient documentation

## 2015-04-25 DIAGNOSIS — Z79899 Other long term (current) drug therapy: Secondary | ICD-10-CM | POA: Insufficient documentation

## 2015-04-25 LAB — GLUCOSE, CAPILLARY: GLUCOSE-CAPILLARY: 83 mg/dL (ref 65–99)

## 2015-04-25 MED ORDER — FLUDEOXYGLUCOSE F - 18 (FDG) INJECTION
6.9000 | Freq: Once | INTRAVENOUS | Status: AC | PRN
  Administered 2015-04-25: 6.9 via INTRAVENOUS

## 2015-04-26 ENCOUNTER — Ambulatory Visit (HOSPITAL_BASED_OUTPATIENT_CLINIC_OR_DEPARTMENT_OTHER): Payer: BLUE CROSS/BLUE SHIELD | Admitting: Hematology and Oncology

## 2015-04-26 ENCOUNTER — Ambulatory Visit (HOSPITAL_BASED_OUTPATIENT_CLINIC_OR_DEPARTMENT_OTHER): Payer: BLUE CROSS/BLUE SHIELD

## 2015-04-26 ENCOUNTER — Other Ambulatory Visit (HOSPITAL_COMMUNITY)
Admission: RE | Admit: 2015-04-26 | Discharge: 2015-04-26 | Disposition: A | Discharge status: Home / Self Care | Payer: BLUE CROSS/BLUE SHIELD | Source: Ambulatory Visit | Attending: Hematology and Oncology | Admitting: Hematology and Oncology

## 2015-04-26 ENCOUNTER — Ambulatory Visit: Payer: BLUE CROSS/BLUE SHIELD

## 2015-04-26 ENCOUNTER — Encounter: Payer: Self-pay | Admitting: Hematology and Oncology

## 2015-04-26 ENCOUNTER — Telehealth: Payer: Self-pay | Admitting: *Deleted

## 2015-04-26 ENCOUNTER — Other Ambulatory Visit (HOSPITAL_BASED_OUTPATIENT_CLINIC_OR_DEPARTMENT_OTHER): Payer: BLUE CROSS/BLUE SHIELD

## 2015-04-26 VITALS — BP 114/76 | HR 77 | Temp 97.8°F | Resp 16

## 2015-04-26 VITALS — BP 125/76 | HR 76 | Temp 98.0°F | Resp 18 | Ht 60.0 in | Wt 134.7 lb

## 2015-04-26 DIAGNOSIS — G4700 Insomnia, unspecified: Secondary | ICD-10-CM

## 2015-04-26 DIAGNOSIS — C8190 Hodgkin lymphoma, unspecified, unspecified site: Secondary | ICD-10-CM | POA: Diagnosis present

## 2015-04-26 DIAGNOSIS — T451X5A Adverse effect of antineoplastic and immunosuppressive drugs, initial encounter: Secondary | ICD-10-CM

## 2015-04-26 DIAGNOSIS — D72819 Decreased white blood cell count, unspecified: Secondary | ICD-10-CM

## 2015-04-26 DIAGNOSIS — D701 Agranulocytosis secondary to cancer chemotherapy: Secondary | ICD-10-CM

## 2015-04-26 DIAGNOSIS — R110 Nausea: Secondary | ICD-10-CM

## 2015-04-26 LAB — CBC WITH DIFFERENTIAL/PLATELET
BASO%: 0.4 % (ref 0.0–2.0)
BASOS ABS: 0 10*3/uL (ref 0.0–0.1)
EOS%: 4.1 % (ref 0.0–7.0)
Eosinophils Absolute: 0.1 10*3/uL (ref 0.0–0.5)
HCT: 34.5 % — ABNORMAL LOW (ref 34.8–46.6)
HEMOGLOBIN: 12.1 g/dL (ref 11.6–15.9)
LYMPH#: 1.4 10*3/uL (ref 0.9–3.3)
LYMPH%: 55.1 % — AB (ref 14.0–49.7)
MCH: 32.2 pg (ref 25.1–34.0)
MCHC: 35.1 g/dL (ref 31.5–36.0)
MCV: 91.8 fL (ref 79.5–101.0)
MONO#: 0.4 10*3/uL (ref 0.1–0.9)
MONO%: 16.3 % — AB (ref 0.0–14.0)
NEUT#: 0.6 10*3/uL — ABNORMAL LOW (ref 1.5–6.5)
NEUT%: 24.1 % — ABNORMAL LOW (ref 38.4–76.8)
Platelets: 213 10*3/uL (ref 145–400)
RBC: 3.76 10*6/uL (ref 3.70–5.45)
RDW: 14.6 % — ABNORMAL HIGH (ref 11.2–14.5)
WBC: 2.5 10*3/uL — ABNORMAL LOW (ref 3.9–10.3)

## 2015-04-26 LAB — COMPREHENSIVE METABOLIC PANEL (CC13)
ALT: 15 U/L (ref 0–55)
AST: 16 U/L (ref 5–34)
Albumin: 3.8 g/dL (ref 3.5–5.0)
Alkaline Phosphatase: 48 U/L (ref 40–150)
Anion Gap: 8 mEq/L (ref 3–11)
BUN: 9.3 mg/dL (ref 7.0–26.0)
CHLORIDE: 111 meq/L — AB (ref 98–109)
CO2: 24 meq/L (ref 22–29)
Calcium: 8.6 mg/dL (ref 8.4–10.4)
Creatinine: 0.7 mg/dL (ref 0.6–1.1)
EGFR: >90 mL/min/{1.73_m2} (ref >90)
GLUCOSE: 112 mg/dL (ref 70–140)
Potassium: 3.6 mEq/L (ref 3.5–5.1)
Sodium: 143 mEq/L (ref 136–145)
Total Bilirubin: 0.37 mg/dL (ref 0.20–1.20)
Total Protein: 6.3 g/dL — ABNORMAL LOW (ref 6.4–8.3)

## 2015-04-26 LAB — PREGNANCY, URINE: PREG TEST UR: NEGATIVE

## 2015-04-26 MED ORDER — SODIUM CHLORIDE 0.9 % IJ SOLN
10.0000 mL | INTRAMUSCULAR | Status: DC | PRN
  Administered 2015-04-26: 10 mL via INTRAVENOUS
  Filled 2015-04-26: qty 10

## 2015-04-26 MED ORDER — PROMETHAZINE HCL 25 MG PO TABS: 25.0000 mg | ORAL_TABLET | Freq: Four times a day (QID) | ORAL | Status: DC | PRN

## 2015-04-26 MED ORDER — HEPARIN SOD (PORK) LOCK FLUSH 100 UNIT/ML IV SOLN
500.0000 [IU] | Freq: Once | INTRAVENOUS | Status: AC
  Administered 2015-04-26: 500 [IU] via INTRAVENOUS
  Filled 2015-04-26: qty 5

## 2015-04-26 MED ORDER — ZOLPIDEM TARTRATE 10 MG PO TABS: 10.0000 mg | ORAL_TABLET | Freq: Every evening | ORAL | Status: DC | PRN

## 2015-04-26 NOTE — Progress Notes (Signed)
[facility] OFF[facility] PROGRESS NOTE  Patient Care Team: No Pcp Per Patient as PCP - General (General Practice) Izora Gala, MD as Consulting Physician (Otolaryngology)  SUMMARY OF ONCOLOGIC HISTORY:   Hodgkin lymphoma   02/08/2015 Surgery she underwent excisional lymph node biopsy of the left supraclavicular mass   02/08/2015 Pathology Results Accession: [id number] Biopsy showed Classical Hodgkin Lymphoma   02/22/2015 Imaging ECHO is normal   02/25/2015 Procedure She has port placement   02/26/2015 Miscellaneous PFT is normal   02/28/2015 Bone Marrow Biopsy Accession: [id number] BM biopsy was negative   03/01/2015 -  Chemotherapy She received ABVD   04/25/2015 Imaging  PET CT scan showed complete response to treatment.    INTERVAL HISTORY: Please see below for problem oriented charting. She returns for further follow-up and review test results. She has profound nausea and vomiting with the last cycle of treatment. She continues to be amenorrheic. She has intermittent sweats at night which resolved spontaneously. Denies recent fevers or chills.  REVIEW OF SYSTEMS:   Constitutional: Denies fevers, chills or abnormal weight loss Eyes: Denies blurriness of vision Ears, nose, mouth, throat, and face: Denies mucositis or sore throat Respiratory: Denies cough, dyspnea or wheezes Cardiovascular: Denies palpitation, chest discomfort or lower extremity swelling Skin: Denies abnormal skin rashes Lymphatics: Denies new lymphadenopathy or easy bruising Neurological:Denies numbness, tingling or new weaknesses Behavioral/Psych: Mood is stable, no new changes  All other systems were reviewed with the patient and are negative.  I have reviewed the past medical history, past surgical history, social history and family history with the patient and they are unchanged from previous note.  ALLERGIES:  has No Known Allergies.  MEDICATIONS:  Current Outpatient Prescriptions  Medication Sig  Dispense Refill  . etonogestrel-ethinyl estradiol (NUVARING) 0.12-0.015 MG/24HR vaginal ring Place 1 each vaginally every 28 (twenty-eight) days. Insert vaginally and leave in place for 3 consecutive weeks, then remove for 1 week.    . Fiber POWD Take 4 g by mouth 2 (two) times daily.    Marland Kitchen HYDROcodone-acetaminophen (NORCO) 7.5-325 MG per tablet Take 1 tablet by mouth every 6 (six) hours as needed for moderate pain. 30 tablet 0  . lidocaine-prilocaine (EMLA) cream Apply to affected area once 30 g 3  . NON FORMULARY Take 1 tablet by mouth 2 (two) times daily. Balance health omega    . NON FORMULARY Take 3 tablets by mouth 2 (two) times daily. Nutralite doubleX multivitamin pack    . NON FORMULARY Take 1 tablet by mouth 2 (two) times daily. Garlic-663m, peppermint leaf- 325m [id number] garlic heart care tab    . NON FORMULARY Take 1 tablet by mouth 2 (two) times daily. Joint health + vit C    . ondansetron (ZOFRAN) 8 MG tablet Take 1 tablet (8 mg total) by mouth every 8 (eight) hours as needed for nausea or vomiting. 90 tablet 3  . promethazine (PHENERGAN) 25 MG tablet Take 1 tablet (25 mg total) by mouth every 6 (six) hours as needed for nausea or vomiting. 60 tablet 3  . zolpidem (AMBIEN) 10 MG tablet Take 1 tablet (10 mg total) by mouth at bedtime as needed for sleep. 30 tablet 0   No current facility-administered medications for this visit.    PHYSICAL EXAMINATION: ECOG PERFORMANCE STATUS: 1 - Symptomatic but completely ambulatory  Filed Vitals:   04/26/15 1127  BP: 125/76  Pulse: 76  Temp: 98 F (36.7 C)  Resp: 18   Filed Weights  04/26/15 1127  Weight: 134 lb 11.2 oz (61.1 kg)    GENERAL:alert, no distress and comfortable SKIN: skin color, texture, turgor are normal, no rashes or significant lesions EYES: normal, Conjunctiva are pink and non-injected, sclera clear OROPHARYNX:no exudate, no erythema and lips, buccal mucosa, and tongue normal  NECK: supple,  thyroid normal size, non-tender, without nodularity LYMPH:  no palpable lymphadenopathy in the cervical, axillary or inguinal LUNGS: clear to auscultation and percussion with normal breathing effort HEART: regular rate & rhythm and no murmurs and no lower extremity edema ABDOMEN:abdomen soft, non-tender and normal bowel sounds Musculoskeletal:no cyanosis of digits and no clubbing  NEURO: alert & oriented x 3 with fluent speech, no focal motor/sensory deficits  LABORATORY DATA:  I have reviewed the data as listed    Component Value Date/Time   NA 143 04/26/2015 1035   NA 139 02/28/2015 0740   K 3.6 04/26/2015 1035   K 3.4* 02/28/2015 0740   CL 106 02/28/2015 0740   CO2 24 04/26/2015 1035   CO2 22 02/28/2015 0740   GLUCOSE 112 04/26/2015 1035   GLUCOSE 98 02/28/2015 0740   BUN 9.3 04/26/2015 1035   BUN 13 02/28/2015 0740   CREATININE 0.7 04/26/2015 1035   CREATININE 0.66 02/28/2015 0740   CALCIUM 8.6 04/26/2015 1035   CALCIUM 8.9 02/28/2015 0740   PROT 6.3* 04/26/2015 1035   PROT 7.4 02/28/2015 0740   ALBUMIN 3.8 04/26/2015 1035   ALBUMIN 3.5 02/28/2015 0740   AST 16 04/26/2015 1035   AST 22 02/28/2015 0740   ALT 15 04/26/2015 1035   ALT 16 02/28/2015 0740   ALKPHOS 48 04/26/2015 1035   ALKPHOS 69 02/28/2015 0740   BILITOT 0.37 04/26/2015 1035   BILITOT 0.6 02/28/2015 0740   GFRNONAA >60 02/28/2015 0740   GFRAA >60 02/28/2015 0740    No results found for: SPEP, UPEP  Lab Results  Component Value Date   WBC 2.5* 04/26/2015   NEUTROABS 0.6* 04/26/2015   HGB 12.1 04/26/2015   HCT 34.5* 04/26/2015   MCV 91.8 04/26/2015   PLT 213 04/26/2015      Chemistry      Component Value Date/Time   NA 143 04/26/2015 1035   NA 139 02/28/2015 0740   K 3.6 04/26/2015 1035   K 3.4* 02/28/2015 0740   CL 106 02/28/2015 0740   CO2 24 04/26/2015 1035   CO2 22 02/28/2015 0740   BUN 9.3 04/26/2015 1035   BUN 13 02/28/2015 0740   CREATININE 0.7 04/26/2015 1035   CREATININE 0.66  02/28/2015 0740      Component Value Date/Time   CALCIUM 8.6 04/26/2015 1035   CALCIUM 8.9 02/28/2015 0740   ALKPHOS 48 04/26/2015 1035   ALKPHOS 69 02/28/2015 0740   AST 16 04/26/2015 1035   AST 22 02/28/2015 0740   ALT 15 04/26/2015 1035   ALT 16 02/28/2015 0740   BILITOT 0.37 04/26/2015 1035   BILITOT 0.6 02/28/2015 0740       RADIOGRAPHIC STUDIES: I reviewed the imaging study with the patient I have personally reviewed the radiological images as listed and agreed with the findings in the report. Nm Pet Image Restag (ps) Skull Base To Thigh  04/25/2015   CLINICAL DATA:  Subsequent treatment strategy for Hodgkin's lymphoma. Undergoing chemotherapy.  EXAM: NUCLEAR MEDICINE PET SKULL BASE TO THIGH  TECHNIQUE: 6.9 mCi F-18 FDG was injected intravenously. Full-ring PET imaging was performed from the skull base to thigh after the radiotracer. CT data was  obtained and used for attenuation correction and anatomic localization.  FASTING BLOOD GLUCOSE:  Value: 83 mg/dl  COMPARISON:  02/26/2015  FINDINGS: NECK  No hypermetabolic lymph nodes in the neck. Decreased size of postop fluid collection at site of previous left supraclavicular lymph node excision since previous study.  CHEST  Mediastinal lymphadenopathy has decreased since previous study, with largest index site of lymphadenopathy in the lateral aortic region currently measuring 2.2 x 4.2 cm on image 55/series 4 compared to 3.3 x 5.2 cm previously. This residual mediastinal lymphadenopathy shows absence of metabolic activity. No residual hypermetabolic lymphadenopathy identified.  ABDOMEN/PELVIS  No abnormal hypermetabolic activity within the liver, pancreas, adrenal glands, or spleen. No hypermetabolic lymph nodes in the abdomen or pelvis.  SKELETON  No focal hypermetabolic activity to suggest skeletal metastasis.  IMPRESSION: Mediastinal lymphadenopathy has decreased in size since previous study and shows no residual metabolic activity,  consistent with complete metabolic response to therapy.   Electronically Signed   By: Earle Gell M.D.   On: 04/25/2015 14:35     ASSESSMENT & PLAN:  Hodgkin lymphoma I reviewed her PET scan with her and her boyfriend. The patient has achieved complete response to treatment after 2 cycles. She had difficulties tolerating chemotherapy with significant fatigue, nausea and vomiting. I reviewed with her the current NCCN guidelines. She had nonbulky disease and would be considered a stage IIA. With the current guidelines, one could consider either doing a few more cycles of chemotherapy or proceed straight to radiation treatment. The patient is undecided. I recommend radiation oncology consultation for formal evaluation and treatment recommendation before we proceed. She agreed with the plan of care.  Leukopenia due to antineoplastic chemotherapy This is likely due to recent treatment. The patient denies recent history of fevers, cough, chills, diarrhea or dysuria. She is asymptomatic from the leukopenia. I will observe for now.   Chemotherapy-induced nausea The patient was given Aloxi and Emend and yet still have persistent nausea. I refill her prescription of anti-emetics today. I recommend strongly she consider radiation treatment to spare her from further side effects of chemotherapy.    Orders Placed This Encounter  Procedures  . Ambulatory referral to Radiation Oncology    Referral Priority:  Routine    Referral Type:  Consultation    Referral Reason:  Specialty Services Required    Referred to Provider:  Eppie Gibson, MD    Requested Specialty:  Radiation Oncology    Number of Visits Requested:  1   All questions were answered. The patient knows to call the clinic with any problems, questions or concerns. No barriers to learning was detected. I spent 30 minutes counseling the patient face to face. The total time spent in the appointment was 40 minutes and more than 50% was on  counseling and review of test results     [facility], [place], MD 04/26/2015 3:01 PM

## 2015-04-26 NOTE — Assessment & Plan Note (Signed)
I reviewed her PET scan with her and her boyfriend. The patient has achieved complete response to treatment after 2 cycles. She had difficulties tolerating chemotherapy with significant fatigue, nausea and vomiting. I reviewed with her the current NCCN guidelines. She had nonbulky disease and would be considered a stage IIA. With the current guidelines, one could consider either doing a few more cycles of chemotherapy or proceed straight to radiation treatment. The patient is undecided. I recommend radiation oncology consultation for formal evaluation and treatment recommendation before we proceed. She agreed with the plan of care.

## 2015-04-26 NOTE — Assessment & Plan Note (Signed)
This is likely due to recent treatment. The patient denies recent history of fevers, cough, chills, diarrhea or dysuria. She is asymptomatic from the leukopenia. I will observe for now.    

## 2015-04-26 NOTE — Assessment & Plan Note (Signed)
The patient was given Aloxi and Emend and yet still have persistent nausea. I refill her prescription of anti-emetics today. I recommend strongly she consider radiation treatment to spare her from further side effects of chemotherapy.

## 2015-04-26 NOTE — Telephone Encounter (Signed)
Pt asks Dr. Alvy Bimler if/when she is supposed to get another injection of the Lupron?

## 2015-04-28 NOTE — Telephone Encounter (Signed)
Only is she decides she wants more chemo

## 2015-04-29 ENCOUNTER — Telehealth: Payer: Self-pay | Admitting: *Deleted

## 2015-04-29 NOTE — Telephone Encounter (Signed)
Pt concerned her appt with Dr. Isidore Moos is not until 8/17 and she wanted to make sure this is ok and Dr. Alvy Bimler didn't want her seen sooner?

## 2015-04-30 ENCOUNTER — Telehealth: Payer: Self-pay | Admitting: Hematology and Oncology

## 2015-04-30 NOTE — Telephone Encounter (Signed)
Informed pt of Dr. Calton Dach reply below.  She verbalized understanding and agrees to getting Lupron injection tomorrow.

## 2015-04-30 NOTE — Telephone Encounter (Signed)
My thoughts:  1) Lupron: technically she is due this week. Since she has not made up her mind about chemo, I would recommend we go ahead and give her one dose in case she wants to go back on chemo after her rad onc eval. Please schedule this 2) Rad onc appt: 8/17 is acceptable, only next week, OK to wait while she recovers from her Rx

## 2015-04-30 NOTE — Telephone Encounter (Signed)
Called patient and she is aware of her 8/10 inj  anne

## 2015-05-01 ENCOUNTER — Ambulatory Visit: Payer: BLUE CROSS/BLUE SHIELD

## 2015-05-02 ENCOUNTER — Ambulatory Visit (HOSPITAL_BASED_OUTPATIENT_CLINIC_OR_DEPARTMENT_OTHER): Payer: BLUE CROSS/BLUE SHIELD

## 2015-05-02 ENCOUNTER — Telehealth: Payer: Self-pay | Admitting: Hematology and Oncology

## 2015-05-02 VITALS — BP 118/77 | HR 73 | Temp 97.8°F

## 2015-05-02 DIAGNOSIS — Z5111 Encounter for antineoplastic chemotherapy: Secondary | ICD-10-CM

## 2015-05-02 DIAGNOSIS — C8190 Hodgkin lymphoma, unspecified, unspecified site: Secondary | ICD-10-CM

## 2015-05-02 MED ORDER — LEUPROLIDE ACETATE 7.5 MG IM KIT
7.5000 mg | PACK | INTRAMUSCULAR | Status: DC
  Administered 2015-05-02: 7.5 mg via INTRAMUSCULAR
  Filled 2015-05-02: qty 7.5

## 2015-05-02 NOTE — Telephone Encounter (Signed)
Returned called to patient re rescheduling 8/10 injection - per patient not feeling well. Left  Message for patient to call back to reschedule. Per message patient wanted to come in today, however I am not able to reach her to confirm an appointment time for today.

## 2015-05-02 NOTE — Telephone Encounter (Signed)
Returned patient call re rescheduling 8/10 inj for today. Spoke with patient re inj appointment for today at 3:30 pm.

## 2015-05-08 ENCOUNTER — Encounter: Payer: Self-pay | Admitting: *Deleted

## 2015-05-08 ENCOUNTER — Other Ambulatory Visit: Payer: Self-pay | Admitting: Hematology and Oncology

## 2015-05-08 ENCOUNTER — Ambulatory Visit
Admission: RE | Admit: 2015-05-08 | Discharge: 2015-05-08 | Disposition: A | Discharge status: Home / Self Care | Payer: BLUE CROSS/BLUE SHIELD | Source: Ambulatory Visit | Attending: Radiation Oncology | Admitting: Radiation Oncology

## 2015-05-08 ENCOUNTER — Telehealth: Payer: Self-pay | Admitting: *Deleted

## 2015-05-08 ENCOUNTER — Ambulatory Visit: Payer: BLUE CROSS/BLUE SHIELD

## 2015-05-08 NOTE — Telephone Encounter (Signed)
Heath Lark, MD at 05/08/2015 9:37 AM     Status: Signed       Expand All Collapse All   Can you ask her to come in today at 1230 pm to discuss this? I cannot restart this week, would have to be next week If she cannot make it today, I can reschedule labs, see me and chemo next week What would she prefer?           LM for patient to call regarding appt

## 2015-05-08 NOTE — Telephone Encounter (Signed)
Spoke with patient- would like to come in next week- Monday or Tuesday. Dr Alvy Bimler notified

## 2015-05-08 NOTE — Telephone Encounter (Signed)
Can you ask her to come in today at 1230 pm to discuss this? I cannot restart this week, would have to be next week If she cannot make it today, I can reschedule labs, see me and chemo next week What would she prefer?

## 2015-05-08 NOTE — Progress Notes (Signed)
Pt had a scheduled consult with Dr. Isidore Moos at 9 am this morning.  Check both waiting areas and called listed phone numbers with no response.  Left a message to please call nursing [phone].  Dr. Isidore Moos notified via inbox.

## 2015-05-08 NOTE — Progress Notes (Signed)
Lymphoma Location(s) / Histology: Left Supraclavicular mass  LURINE IMEL presented  months ago with symptoms of:   Biopsies of  (if applicable) revealed: 02/21/6947 Bone Marrow Biopsy Accession: [id number] BM biopsy was negative   Diagnosis:  02/08/15 :Dr. Dellis Filbert Rosen,MD Lymph node for lymphoma, Left supraclavicular - CLASSICAL HODGKIN LYMPHOMA Diagnosis 02/01/15: LYMPH NODE, FINE NEEDLE ASPIRATION, LEFT SUPRACLAVICULAR (SPECIMEN 1 OF 1 COLLECTED 02-01-2015) MALIGNANT CELLS PRESENT. SEE COMMENT. COMMENT: THE SPECIMEN CONTAINS MALIGNANT APPEARING CELLS PRESENT INDIVIDUALLY AND AS SMALL GROUPS. THE CELLS HAVE ENLARGED, IRREGULAR NUCLEI AND VERY PROMINENT NUCLEOLI. THE DIFFERENTIAL DIAGNOSIS INCLUDES A LYMPHOPROLIFERATIVE PROCESS, INCLUDING HODGKINS LYMPHOMA. TISSUE STUDIES FOR FLOW CYTOMETRIC AND IMMUNOHISTOCHEMICAL STUDIES SHOULD BE CONSIDERED.  Source Lymph Node, Fine Needle Aspiration, Left, Supraclavicular, (Specimen 1 of 1, collected on 02/01/2015)  Past/Anticipated interventions by medical oncology, if any: Dr. Alvy Bimler       03/01/2015 -  Chemotherapy She received ABVD   04/25/2015 Imaging PET CT scan showed complete response to treatment.         Weight changes, if any, over the past 6 months:   Recurrent fevers, or drenching night sweats, if any: intermittent sweats  At night  SAFETY ISSUES:  Prior radiation? NO  Pacemaker/ICD? NO  Possible current pregnancy?  Is the patient on methotrexate? NO  Current Complaints / other details:    Allergies:NKA

## 2015-05-08 NOTE — Telephone Encounter (Signed)
Mallory Price left a message stating she would like to cancel her RT appt and would to restart chemo.

## 2015-05-08 NOTE — Telephone Encounter (Signed)
Per staff message and POF I have scheduled appts. Advised scheduler of appts. JMW  

## 2015-05-10 ENCOUNTER — Other Ambulatory Visit: Payer: BLUE CROSS/BLUE SHIELD

## 2015-05-13 ENCOUNTER — Telehealth: Payer: Self-pay | Admitting: Hematology and Oncology

## 2015-05-13 NOTE — Telephone Encounter (Signed)
pt called to confirm appt....pt ok and aware °

## 2015-05-14 ENCOUNTER — Encounter: Payer: Self-pay | Admitting: Hematology and Oncology

## 2015-05-14 ENCOUNTER — Ambulatory Visit: Payer: BLUE CROSS/BLUE SHIELD

## 2015-05-14 ENCOUNTER — Telehealth: Payer: Self-pay | Admitting: Hematology and Oncology

## 2015-05-14 ENCOUNTER — Ambulatory Visit (HOSPITAL_BASED_OUTPATIENT_CLINIC_OR_DEPARTMENT_OTHER): Payer: BLUE CROSS/BLUE SHIELD

## 2015-05-14 ENCOUNTER — Other Ambulatory Visit (HOSPITAL_BASED_OUTPATIENT_CLINIC_OR_DEPARTMENT_OTHER): Payer: BLUE CROSS/BLUE SHIELD

## 2015-05-14 ENCOUNTER — Ambulatory Visit (HOSPITAL_BASED_OUTPATIENT_CLINIC_OR_DEPARTMENT_OTHER): Payer: BLUE CROSS/BLUE SHIELD | Admitting: Hematology and Oncology

## 2015-05-14 VITALS — BP 118/64 | HR 81 | Temp 98.3°F | Resp 18 | Ht 60.0 in | Wt 133.2 lb

## 2015-05-14 DIAGNOSIS — Z95828 Presence of other vascular implants and grafts: Secondary | ICD-10-CM

## 2015-05-14 DIAGNOSIS — Z5111 Encounter for antineoplastic chemotherapy: Secondary | ICD-10-CM

## 2015-05-14 DIAGNOSIS — T451X5A Adverse effect of antineoplastic and immunosuppressive drugs, initial encounter: Secondary | ICD-10-CM

## 2015-05-14 DIAGNOSIS — Z9189 Other specified personal risk factors, not elsewhere classified: Secondary | ICD-10-CM

## 2015-05-14 DIAGNOSIS — C8190 Hodgkin lymphoma, unspecified, unspecified site: Secondary | ICD-10-CM

## 2015-05-14 DIAGNOSIS — G4700 Insomnia, unspecified: Secondary | ICD-10-CM | POA: Diagnosis not present

## 2015-05-14 DIAGNOSIS — R110 Nausea: Secondary | ICD-10-CM

## 2015-05-14 LAB — COMPREHENSIVE METABOLIC PANEL (CC13)
ALT: 21 U/L (ref 0–55)
ANION GAP: 9 meq/L (ref 3–11)
AST: 20 U/L (ref 5–34)
Albumin: 4 g/dL (ref 3.5–5.0)
Alkaline Phosphatase: 58 U/L (ref 40–150)
BILIRUBIN TOTAL: 0.81 mg/dL (ref 0.20–1.20)
BUN: 7.9 mg/dL (ref 7.0–26.0)
CO2: 24 meq/L (ref 22–29)
Calcium: 8.8 mg/dL (ref 8.4–10.4)
Chloride: 110 mEq/L — ABNORMAL HIGH (ref 98–109)
Creatinine: 0.7 mg/dL (ref 0.6–1.1)
EGFR: >90 mL/min/{1.73_m2} (ref >90)
GLUCOSE: 98 mg/dL (ref 70–140)
Potassium: 4 mEq/L (ref 3.5–5.1)
SODIUM: 143 meq/L (ref 136–145)
TOTAL PROTEIN: 6.6 g/dL (ref 6.4–8.3)

## 2015-05-14 LAB — CBC WITH DIFFERENTIAL/PLATELET
BASO%: 1 % (ref 0.0–2.0)
Basophils Absolute: 0 10*3/uL (ref 0.0–0.1)
EOS ABS: 0.2 10*3/uL (ref 0.0–0.5)
EOS%: 5 % (ref 0.0–7.0)
HCT: 37.3 % (ref 34.8–46.6)
HEMOGLOBIN: 12.6 g/dL (ref 11.6–15.9)
LYMPH#: 1.4 10*3/uL (ref 0.9–3.3)
LYMPH%: 37.1 % (ref 14.0–49.7)
MCH: 31.4 pg (ref 25.1–34.0)
MCHC: 33.7 g/dL (ref 31.5–36.0)
MCV: 93.2 fL (ref 79.5–101.0)
MONO#: 0.4 10*3/uL (ref 0.1–0.9)
MONO%: 10.7 % (ref 0.0–14.0)
NEUT%: 46.2 % (ref 38.4–76.8)
NEUTROS ABS: 1.8 10*3/uL (ref 1.5–6.5)
Platelets: 221 10*3/uL (ref 145–400)
RBC: 4 10*6/uL (ref 3.70–5.45)
RDW: 15.2 % — AB (ref 11.2–14.5)
WBC: 3.9 10*3/uL (ref 3.9–10.3)

## 2015-05-14 MED ORDER — SCOPOLAMINE 1 MG/3DAYS TD PT72: 1.0000 | MEDICATED_PATCH | TRANSDERMAL | Status: DC

## 2015-05-14 MED ORDER — DEXAMETHASONE 4 MG PO TABS: ORAL_TABLET | ORAL | Status: DC

## 2015-05-14 MED ORDER — PALONOSETRON HCL INJECTION 0.25 MG/5ML
INTRAVENOUS | Status: AC
  Filled 2015-05-14: qty 5

## 2015-05-14 MED ORDER — BLEOMYCIN SULFATE CHEMO INJECTION 30 UNIT: 10.0000 [IU]/m2 | Freq: Once | INTRAMUSCULAR | Status: DC

## 2015-05-14 MED ORDER — SODIUM CHLORIDE 0.9 % IV SOLN
Freq: Once | INTRAVENOUS | Status: AC
  Administered 2015-05-14: 15:00:00 via INTRAVENOUS

## 2015-05-14 MED ORDER — SODIUM CHLORIDE 0.9 % IV SOLN
Freq: Once | INTRAVENOUS | Status: AC
  Administered 2015-05-14: 15:00:00 via INTRAVENOUS
  Filled 2015-05-14: qty 5

## 2015-05-14 MED ORDER — TEMAZEPAM 15 MG PO CAPS: 15.0000 mg | ORAL_CAPSULE | Freq: Every day | ORAL | Status: DC

## 2015-05-14 MED ORDER — SODIUM CHLORIDE 0.9 % IJ SOLN
10.0000 mL | INTRAMUSCULAR | Status: DC | PRN
  Administered 2015-05-14: 10 mL via INTRAVENOUS
  Filled 2015-05-14: qty 10

## 2015-05-14 MED ORDER — PALONOSETRON HCL INJECTION 0.25 MG/5ML
0.2500 mg | Freq: Once | INTRAVENOUS | Status: AC
  Administered 2015-05-14: 0.25 mg via INTRAVENOUS

## 2015-05-14 MED ORDER — HEPARIN SOD (PORK) LOCK FLUSH 100 UNIT/ML IV SOLN
500.0000 [IU] | Freq: Once | INTRAVENOUS | Status: AC | PRN
  Administered 2015-05-14: 500 [IU]
  Filled 2015-05-14: qty 5

## 2015-05-14 MED ORDER — SODIUM CHLORIDE 0.9 % IJ SOLN
10.0000 mL | INTRAMUSCULAR | Status: DC | PRN
  Administered 2015-05-14: 10 mL
  Filled 2015-05-14: qty 10

## 2015-05-14 MED ORDER — SODIUM CHLORIDE 0.9 % IV SOLN
375.0000 mg/m2 | Freq: Once | INTRAVENOUS | Status: AC
  Administered 2015-05-14: 600 mg via INTRAVENOUS
  Filled 2015-05-14: qty 30

## 2015-05-14 MED ORDER — SODIUM CHLORIDE 0.9 % IV SOLN
6.3000 mg/m2 | Freq: Once | INTRAVENOUS | Status: AC
  Administered 2015-05-14: 10 mg via INTRAVENOUS
  Filled 2015-05-14: qty 10

## 2015-05-14 MED ORDER — SODIUM CHLORIDE 0.9 % IV SOLN
9.7000 [IU]/m2 | Freq: Once | INTRAVENOUS | Status: AC
  Administered 2015-05-14: 15 [IU] via INTRAVENOUS
  Filled 2015-05-14: qty 5

## 2015-05-14 MED ORDER — DOXORUBICIN HCL CHEMO IV INJECTION 2 MG/ML
25.0000 mg/m2 | Freq: Once | INTRAVENOUS | Status: AC
  Administered 2015-05-14: 40 mg via INTRAVENOUS
  Filled 2015-05-14: qty 20

## 2015-05-14 NOTE — Telephone Encounter (Signed)
Pt confirmed labs/ov per 08/23 POF, gave pt avs and calendar... KJ, sent msg to add chemo °

## 2015-05-14 NOTE — Assessment & Plan Note (Signed)
The patient was given Aloxi and Emend and yet still have persistent nausea. I recommend a trial of scopolamine patch along with dexamethasone for 2 days after each round of chemotherapy

## 2015-05-14 NOTE — Assessment & Plan Note (Signed)
The patient has significant insomnia. This is happening even after we put treatment on hold. I recommend a trial of temazepam.

## 2015-05-14 NOTE — Progress Notes (Signed)
[facility] OFFICE PROGRESS NOTE  Patient Care Team: No Pcp Per Patient as PCP - General (General Practice) Izora Gala, MD as Consulting Physician (Otolaryngology)  SUMMARY OF ONCOLOGIC HISTORY:   Hodgkin lymphoma   02/08/2015 Surgery she underwent excisional lymph node biopsy of the left supraclavicular mass   02/08/2015 Pathology Results Accession: [id number] Biopsy showed Classical Hodgkin Lymphoma   02/22/2015 Imaging ECHO is normal   02/25/2015 Procedure She has port placement   02/26/2015 Miscellaneous PFT is normal   02/28/2015 Bone Marrow Biopsy Accession: [id number] BM biopsy was negative   03/01/2015 -  Chemotherapy She received ABVD   04/25/2015 Imaging  PET CT scan showed complete response to treatment.    INTERVAL HISTORY: Please see below for problem oriented charting. She returns here prior to cycle 3 of chemotherapy. The patient herself made an informed decision not to pursue radiation treatment. She denies further nausea or vomiting. She has significant insomnia.  REVIEW OF SYSTEMS:   Constitutional: Denies fevers, chills or abnormal weight loss Eyes: Denies blurriness of vision Ears, nose, mouth, throat, and face: Denies mucositis or sore throat Respiratory: Denies cough, dyspnea or wheezes Cardiovascular: Denies palpitation, chest discomfort or lower extremity swelling Skin: Denies abnormal skin rashes Lymphatics: Denies new lymphadenopathy or easy bruising Neurological:Denies numbness, tingling or new weaknesses Behavioral/Psych: Mood is stable, no new changes  All other systems were reviewed with the patient and are negative.  I have reviewed the past medical history, past surgical history, social history and family history with the patient and they are unchanged from previous note.  ALLERGIES:  has No Known Allergies.  MEDICATIONS:  Current Outpatient Prescriptions  Medication Sig Dispense Refill  . etonogestrel-ethinyl estradiol (NUVARING)  0.12-0.015 MG/24HR vaginal ring Place 1 each vaginally every 28 (twenty-eight) days. Insert vaginally and leave in place for 3 consecutive weeks, then remove for 1 week.    . Fiber POWD Take 4 g by mouth 2 (two) times daily.    . NON FORMULARY Take 1 tablet by mouth 2 (two) times daily. Balance health omega    . NON FORMULARY Take 3 tablets by mouth 2 (two) times daily. Nutralite doubleX multivitamin pack    . NON FORMULARY Take 1 tablet by mouth 2 (two) times daily. Garlic-640m, peppermint leaf- 327m [id number] garlic heart care tab    . NON FORMULARY Take 1 tablet by mouth 2 (two) times daily. Joint health + vit C    . zolpidem (AMBIEN) 10 MG tablet Take 1 tablet (10 mg total) by mouth at bedtime as needed for sleep. 30 tablet 0  . dexamethasone (DECADRON) 4 MG tablet Take 2 tabs with meals for 2 days after chemo 30 tablet 0  . HYDROcodone-acetaminophen (NORCO) 7.5-325 MG per tablet Take 1 tablet by mouth every 6 (six) hours as needed for moderate pain. (Patient not taking: Reported on 05/14/2015) 30 tablet 0  . lidocaine-prilocaine (EMLA) cream Apply to affected area once (Patient not taking: Reported on 05/14/2015) 30 g 3  . ondansetron (ZOFRAN) 8 MG tablet Take 1 tablet (8 mg total) by mouth every 8 (eight) hours as needed for nausea or vomiting. (Patient not taking: Reported on 05/14/2015) 90 tablet 3  . promethazine (PHENERGAN) 25 MG tablet Take 1 tablet (25 mg total) by mouth every 6 (six) hours as needed for nausea or vomiting. (Patient not taking: Reported on 05/14/2015) 60 tablet 3  . scopolamine (TRANSDERM-SCOP) 1 MG/3DAYS Place 1 patch (1.5 mg total) onto the skin every  3 (three) days. 10 patch 12  . temazepam (RESTORIL) 15 MG capsule Take 1 capsule (15 mg total) by mouth at bedtime. 30 capsule 3   No current facility-administered medications for this visit.    PHYSICAL EXAMINATION: ECOG PERFORMANCE STATUS: 1 - Symptomatic but completely ambulatory  Filed Vitals:    05/14/15 1311  BP: 118/64  Pulse: 81  Temp: 98.3 F (36.8 C)  Resp: 18   Filed Weights   05/14/15 1311  Weight: 133 lb 3.2 oz (60.419 kg)    GENERAL:alert, no distress and comfortable SKIN: skin color, texture, turgor are normal, no rashes or significant lesions EYES: normal, Conjunctiva are pink and non-injected, sclera clear OROPHARYNX:no exudate, no erythema and lips, buccal mucosa, and tongue normal  NECK: supple, thyroid normal size, non-tender, without nodularity LYMPH:  no palpable lymphadenopathy in the cervical, axillary or inguinal. She has a palpable seroma on the left supraclavicular region LUNGS: clear to auscultation and percussion with normal breathing effort HEART: regular rate & rhythm and no murmurs and no lower extremity edema ABDOMEN:abdomen soft, non-tender and normal bowel sounds Musculoskeletal:no cyanosis of digits and no clubbing  NEURO: alert & oriented x 3 with fluent speech, no focal motor/sensory deficits  LABORATORY DATA:  I have reviewed the data as listed    Component Value Date/Time   NA 143 05/14/2015 1237   NA 139 02/28/2015 0740   K 4.0 05/14/2015 1237   K 3.4* 02/28/2015 0740   CL 106 02/28/2015 0740   CO2 24 05/14/2015 1237   CO2 22 02/28/2015 0740   GLUCOSE 98 05/14/2015 1237   GLUCOSE 98 02/28/2015 0740   BUN 7.9 05/14/2015 1237   BUN 13 02/28/2015 0740   CREATININE 0.7 05/14/2015 1237   CREATININE 0.66 02/28/2015 0740   CALCIUM 8.8 05/14/2015 1237   CALCIUM 8.9 02/28/2015 0740   PROT 6.6 05/14/2015 1237   PROT 7.4 02/28/2015 0740   ALBUMIN 4.0 05/14/2015 1237   ALBUMIN 3.5 02/28/2015 0740   AST 20 05/14/2015 1237   AST 22 02/28/2015 0740   ALT 21 05/14/2015 1237   ALT 16 02/28/2015 0740   ALKPHOS 58 05/14/2015 1237   ALKPHOS 69 02/28/2015 0740   BILITOT 0.81 05/14/2015 1237   BILITOT 0.6 02/28/2015 0740   GFRNONAA >60 02/28/2015 0740   GFRAA >60 02/28/2015 0740    No results found for: SPEP, UPEP  Lab Results   Component Value Date   WBC 3.9 05/14/2015   NEUTROABS 1.8 05/14/2015   HGB 12.6 05/14/2015   HCT 37.3 05/14/2015   MCV 93.2 05/14/2015   PLT 221 05/14/2015      Chemistry      Component Value Date/Time   NA 143 05/14/2015 1237   NA 139 02/28/2015 0740   K 4.0 05/14/2015 1237   K 3.4* 02/28/2015 0740   CL 106 02/28/2015 0740   CO2 24 05/14/2015 1237   CO2 22 02/28/2015 0740   BUN 7.9 05/14/2015 1237   BUN 13 02/28/2015 0740   CREATININE 0.7 05/14/2015 1237   CREATININE 0.66 02/28/2015 0740      Component Value Date/Time   CALCIUM 8.8 05/14/2015 1237   CALCIUM 8.9 02/28/2015 0740   ALKPHOS 58 05/14/2015 1237   ALKPHOS 69 02/28/2015 0740   AST 20 05/14/2015 1237   AST 22 02/28/2015 0740   ALT 21 05/14/2015 1237   ALT 16 02/28/2015 0740   BILITOT 0.81 05/14/2015 1237   BILITOT 0.6 02/28/2015 0740        ASSESSMENT & PLAN:  Hodgkin lymphoma I have a long discussion with the patient. I presented her case at the last ENT tumor board related to the role of radiation treatment. The patient has made an informed decision not to pursue radiation therapy. She is willing to proceed with 2 more cycles of chemotherapy before restaging PET scan and stopping treatment. Her only complication so far is profound nausea or vomiting. I will start scopolamine patch to try to prevent uncontrolled nausea or vomiting along with dexamethasone for 2 days after each cycle of chemotherapy. I also recommend the patient to call me if she gets dehydrated or inability to tolerate oral fluids. I will see her back in 2 weeks to assess  At risk for fertility problems We discussed fertility preservation. I will order Lupron every 4 weeks     Chemotherapy-induced nausea The patient was given Aloxi and Emend and yet still have persistent nausea. I recommend a trial of scopolamine patch along with dexamethasone for 2 days after each round of chemotherapy     Insomnia The patient has significant  insomnia. This is happening even after we put treatment on hold. I recommend a trial of temazepam.   No orders of the defined types were placed in this encounter.   All questions were answered. The patient knows to call the clinic with any problems, questions or concerns. No barriers to learning was detected. I spent 25 minutes counseling the patient face to face. The total time spent in the appointment was 40 minutes and more than 50% was on counseling and review of test results     GORSUCH, NI, MD 05/14/2015 2:03 PM    

## 2015-05-14 NOTE — Assessment & Plan Note (Signed)
I have a long discussion with the patient. I presented her case at the last ENT tumor board related to the role of radiation treatment. The patient has made an informed decision not to pursue radiation therapy. She is willing to proceed with 2 more cycles of chemotherapy before restaging PET scan and stopping treatment. Her only complication so far is profound nausea or vomiting. I will start scopolamine patch to try to prevent uncontrolled nausea or vomiting along with dexamethasone for 2 days after each cycle of chemotherapy. I also recommend the patient to call me if she gets dehydrated or inability to tolerate oral fluids. I will see her back in 2 weeks to assess

## 2015-05-14 NOTE — Patient Instructions (Signed)
[facility] Discharge Instructions for Patients Receiving Chemotherapy  Today you received the following chemotherapy agents Adriamycin, bleomycin, velban, dacarbazine.  To help prevent nausea and vomiting after your treatment, we encourage you to take your nausea medication  As prescribed. If you develop nausea and vomiting that is not controlled by your nausea medication, call the clinic.   BELOW ARE SYMPTOMS THAT SHOULD BE REPORTED IMMEDIATELY:  *FEVER GREATER THAN 100.5 F  *CHILLS WITH OR WITHOUT FEVER  NAUSEA AND VOMITING THAT IS NOT CONTROLLED WITH YOUR NAUSEA MEDICATION  *UNUSUAL SHORTNESS OF BREATH  *UNUSUAL BRUISING OR BLEEDING  TENDERNESS IN MOUTH AND THROAT WITH OR WITHOUT PRESENCE OF ULCERS  *URINARY PROBLEMS  *BOWEL PROBLEMS  UNUSUAL RASH Items with * indicate a potential emergency and should be followed up as soon as possible.  Feel free to call the clinic you have any questions or concerns. The clinic phone number is [phone].  Please show the [facility] at check-in to the Emergency Department and triage nurse.

## 2015-05-14 NOTE — Assessment & Plan Note (Signed)
We discussed fertility preservation. I will order Lupron every 4 weeks

## 2015-05-15 ENCOUNTER — Telehealth: Payer: Self-pay | Admitting: *Deleted

## 2015-05-15 LAB — PREGNANCY, URINE: PREG TEST UR: NEGATIVE

## 2015-05-15 NOTE — Telephone Encounter (Signed)
Per staff message and POF I have scheduled appts. Advised scheduler of appts. JMW  

## 2015-05-17 ENCOUNTER — Other Ambulatory Visit: Payer: Self-pay | Admitting: *Deleted

## 2015-05-17 DIAGNOSIS — G4700 Insomnia, unspecified: Secondary | ICD-10-CM

## 2015-05-17 MED ORDER — HYDROCODONE-ACETAMINOPHEN 7.5-325 MG PO TABS: 1.0000 | ORAL_TABLET | Freq: Four times a day (QID) | ORAL | Status: DC | PRN

## 2015-05-17 MED ORDER — ZOLPIDEM TARTRATE 10 MG PO TABS: 10.0000 mg | ORAL_TABLET | Freq: Every evening | ORAL | Status: DC | PRN

## 2015-05-29 ENCOUNTER — Other Ambulatory Visit (HOSPITAL_COMMUNITY)
Admission: RE | Admit: 2015-05-29 | Discharge: 2015-05-29 | Disposition: A | Discharge status: Home / Self Care | Payer: BLUE CROSS/BLUE SHIELD | Source: Ambulatory Visit | Attending: Hematology and Oncology | Admitting: Hematology and Oncology

## 2015-05-29 ENCOUNTER — Ambulatory Visit: Payer: BLUE CROSS/BLUE SHIELD

## 2015-05-29 ENCOUNTER — Other Ambulatory Visit (HOSPITAL_BASED_OUTPATIENT_CLINIC_OR_DEPARTMENT_OTHER): Payer: BLUE CROSS/BLUE SHIELD

## 2015-05-29 ENCOUNTER — Encounter: Payer: Self-pay | Admitting: Hematology and Oncology

## 2015-05-29 ENCOUNTER — Ambulatory Visit (HOSPITAL_BASED_OUTPATIENT_CLINIC_OR_DEPARTMENT_OTHER): Payer: BLUE CROSS/BLUE SHIELD | Admitting: Hematology and Oncology

## 2015-05-29 ENCOUNTER — Ambulatory Visit (HOSPITAL_BASED_OUTPATIENT_CLINIC_OR_DEPARTMENT_OTHER): Payer: BLUE CROSS/BLUE SHIELD

## 2015-05-29 ENCOUNTER — Telehealth: Payer: Self-pay | Admitting: Hematology and Oncology

## 2015-05-29 ENCOUNTER — Other Ambulatory Visit: Payer: Self-pay | Admitting: Hematology and Oncology

## 2015-05-29 VITALS — BP 119/71 | HR 96 | Temp 98.0°F | Resp 18 | Ht 60.0 in | Wt 130.0 lb

## 2015-05-29 DIAGNOSIS — G4700 Insomnia, unspecified: Secondary | ICD-10-CM

## 2015-05-29 DIAGNOSIS — C8190 Hodgkin lymphoma, unspecified, unspecified site: Secondary | ICD-10-CM | POA: Insufficient documentation

## 2015-05-29 DIAGNOSIS — Z5111 Encounter for antineoplastic chemotherapy: Secondary | ICD-10-CM

## 2015-05-29 DIAGNOSIS — D72819 Decreased white blood cell count, unspecified: Secondary | ICD-10-CM

## 2015-05-29 DIAGNOSIS — T451X5A Adverse effect of antineoplastic and immunosuppressive drugs, initial encounter: Secondary | ICD-10-CM

## 2015-05-29 DIAGNOSIS — D701 Agranulocytosis secondary to cancer chemotherapy: Secondary | ICD-10-CM

## 2015-05-29 DIAGNOSIS — Z9189 Other specified personal risk factors, not elsewhere classified: Secondary | ICD-10-CM

## 2015-05-29 DIAGNOSIS — R110 Nausea: Secondary | ICD-10-CM | POA: Diagnosis not present

## 2015-05-29 DIAGNOSIS — Z95828 Presence of other vascular implants and grafts: Secondary | ICD-10-CM

## 2015-05-29 LAB — CBC WITH DIFFERENTIAL/PLATELET
BASO%: 1 % (ref 0.0–2.0)
BASOS ABS: 0 10*3/uL (ref 0.0–0.1)
EOS ABS: 0.1 10*3/uL (ref 0.0–0.5)
EOS%: 3.5 % (ref 0.0–7.0)
HEMATOCRIT: 34.9 % (ref 34.8–46.6)
HEMOGLOBIN: 12.1 g/dL (ref 11.6–15.9)
LYMPH#: 1.3 10*3/uL (ref 0.9–3.3)
LYMPH%: 65.2 % — ABNORMAL HIGH (ref 14.0–49.7)
MCH: 31.4 pg (ref 25.1–34.0)
MCHC: 34.7 g/dL (ref 31.5–36.0)
MCV: 90.6 fL (ref 79.5–101.0)
MONO#: 0.3 10*3/uL (ref 0.1–0.9)
MONO%: 13.4 % (ref 0.0–14.0)
NEUT#: 0.3 10*3/uL — CL (ref 1.5–6.5)
NEUT%: 16.9 % — AB (ref 38.4–76.8)
NRBC: 0 % (ref 0–0)
Platelets: 254 10*3/uL (ref 145–400)
RBC: 3.85 10*6/uL (ref 3.70–5.45)
RDW: 14.3 % (ref 11.2–14.5)
WBC: 2 10*3/uL — ABNORMAL LOW (ref 3.9–10.3)

## 2015-05-29 LAB — COMPREHENSIVE METABOLIC PANEL (CC13)
ALBUMIN: 3.9 g/dL (ref 3.5–5.0)
ALK PHOS: 51 U/L (ref 40–150)
ALT: 16 U/L (ref 0–55)
AST: 18 U/L (ref 5–34)
Anion Gap: 9 mEq/L (ref 3–11)
BILIRUBIN TOTAL: 0.54 mg/dL (ref 0.20–1.20)
BUN: 7.2 mg/dL (ref 7.0–26.0)
CALCIUM: 8.9 mg/dL (ref 8.4–10.4)
CO2: 23 mEq/L (ref 22–29)
Chloride: 109 mEq/L (ref 98–109)
Creatinine: 0.7 mg/dL (ref 0.6–1.1)
GLUCOSE: 141 mg/dL — AB (ref 70–140)
POTASSIUM: 3.5 meq/L (ref 3.5–5.1)
Sodium: 141 mEq/L (ref 136–145)
TOTAL PROTEIN: 6.5 g/dL (ref 6.4–8.3)

## 2015-05-29 LAB — PREGNANCY, URINE: PREG TEST UR: NEGATIVE

## 2015-05-29 MED ORDER — SODIUM CHLORIDE 0.9 % IJ SOLN
10.0000 mL | INTRAMUSCULAR | Status: DC | PRN
  Administered 2015-05-29: 10 mL via INTRAVENOUS
  Filled 2015-05-29: qty 10

## 2015-05-29 MED ORDER — VINBLASTINE SULFATE CHEMO INJECTION 1 MG/ML
6.3000 mg/m2 | Freq: Once | INTRAVENOUS | Status: AC
  Administered 2015-05-29: 10 mg via INTRAVENOUS
  Filled 2015-05-29: qty 10

## 2015-05-29 MED ORDER — SODIUM CHLORIDE 0.9 % IV SOLN
Freq: Once | INTRAVENOUS | Status: AC
  Administered 2015-05-29: 13:00:00 via INTRAVENOUS

## 2015-05-29 MED ORDER — SODIUM CHLORIDE 0.9 % IV SOLN
375.0000 mg/m2 | Freq: Once | INTRAVENOUS | Status: AC
  Administered 2015-05-29: 600 mg via INTRAVENOUS
  Filled 2015-05-29: qty 30

## 2015-05-29 MED ORDER — PALONOSETRON HCL INJECTION 0.25 MG/5ML
0.2500 mg | Freq: Once | INTRAVENOUS | Status: AC
  Administered 2015-05-29: 0.25 mg via INTRAVENOUS

## 2015-05-29 MED ORDER — PALONOSETRON HCL INJECTION 0.25 MG/5ML
INTRAVENOUS | Status: AC
Start: 2015-05-29 — End: 2015-05-29
  Filled 2015-05-29: qty 5

## 2015-05-29 MED ORDER — SODIUM CHLORIDE 0.9 % IV SOLN
9.6000 [IU]/m2 | Freq: Once | INTRAVENOUS | Status: AC
  Administered 2015-05-29: 15 [IU] via INTRAVENOUS
  Filled 2015-05-29: qty 5

## 2015-05-29 MED ORDER — FOSAPREPITANT DIMEGLUMINE INJECTION 150 MG
Freq: Once | INTRAVENOUS | Status: AC
  Administered 2015-05-29: 13:00:00 via INTRAVENOUS
  Filled 2015-05-29: qty 5

## 2015-05-29 MED ORDER — SODIUM CHLORIDE 0.9 % IJ SOLN
10.0000 mL | INTRAMUSCULAR | Status: DC | PRN
  Filled 2015-05-29: qty 10

## 2015-05-29 MED ORDER — LEUPROLIDE ACETATE 7.5 MG IM KIT
7.5000 mg | PACK | INTRAMUSCULAR | Status: DC
  Administered 2015-05-29: 7.5 mg via INTRAMUSCULAR
  Filled 2015-05-29: qty 7.5

## 2015-05-29 MED ORDER — HEPARIN SOD (PORK) LOCK FLUSH 100 UNIT/ML IV SOLN
500.0000 [IU] | Freq: Once | INTRAVENOUS | Status: DC | PRN
  Filled 2015-05-29: qty 5

## 2015-05-29 MED ORDER — DOXORUBICIN HCL CHEMO IV INJECTION 2 MG/ML
25.0000 mg/m2 | Freq: Once | INTRAVENOUS | Status: AC
  Administered 2015-05-29: 40 mg via INTRAVENOUS
  Filled 2015-05-29: qty 20

## 2015-05-29 NOTE — Progress Notes (Signed)
Lupron injection given by the infusion nurse.

## 2015-05-29 NOTE — Progress Notes (Signed)
[facility] OFFICE PROGRESS NOTE  Patient Care Team: No Pcp Per Patient as PCP - General (General Practice) Izora Gala, MD as Consulting Physician (Otolaryngology)  SUMMARY OF ONCOLOGIC HISTORY:   Hodgkin lymphoma   02/08/2015 Surgery she underwent excisional lymph node biopsy of the left supraclavicular mass   02/08/2015 Pathology Results Accession: [id number] Biopsy showed Classical Hodgkin Lymphoma   02/22/2015 Imaging ECHO is normal   02/25/2015 Procedure She has port placement   02/26/2015 Miscellaneous PFT is normal   02/28/2015 Bone Marrow Biopsy Accession: [id number] BM biopsy was negative   03/01/2015 -  Chemotherapy She received ABVD   04/25/2015 Imaging  PET CT scan showed complete response to treatment.    INTERVAL HISTORY: Please see below for problem oriented charting. She is seen today prior to cycle 3, day 15 of treatment. Her nausea has somewhat improved compared to prior visit. She has persistent nausea but no vomiting. She continues to have insomnia. Temazepam helped. She have persistent lump in the left supraclavicular region from prior biopsy. She has no recent menstrual cycles. Denies neuropathy. No recent infection  REVIEW OF SYSTEMS:   Constitutional: Denies fevers, chills or abnormal weight loss Eyes: Denies blurriness of vision Ears, nose, mouth, throat, and face: Denies mucositis or sore throat Respiratory: Denies cough, dyspnea or wheezes Cardiovascular: Denies palpitation, chest discomfort or lower extremity swelling Skin: Denies abnormal skin rashes Lymphatics: Denies new lymphadenopathy or easy bruising Neurological:Denies numbness, tingling or new weaknesses Behavioral/Psych: Mood is stable, no new changes  All other systems were reviewed with the patient and are negative.  I have reviewed the past medical history, past surgical history, social history and family history with the patient and they are unchanged from previous  note.  ALLERGIES:  has No Known Allergies.  MEDICATIONS:  Current Outpatient Prescriptions  Medication Sig Dispense Refill  . dexamethasone (DECADRON) 4 MG tablet Take 2 tabs with meals for 2 days after chemo 30 tablet 0  . etonogestrel-ethinyl estradiol (NUVARING) 0.12-0.015 MG/24HR vaginal ring Place 1 each vaginally every 28 (twenty-eight) days. Insert vaginally and leave in place for 3 consecutive weeks, then remove for 1 week.    . Fiber POWD Take 4 g by mouth 2 (two) times daily.    Marland Kitchen HYDROcodone-acetaminophen (NORCO) 7.5-325 MG per tablet Take 1 tablet by mouth every 6 (six) hours as needed for moderate pain. 30 tablet 0  . lidocaine-prilocaine (EMLA) cream Apply to affected area once 30 g 3  . NON FORMULARY Take 1 tablet by mouth 2 (two) times daily. Balance health omega    . NON FORMULARY Take 3 tablets by mouth 2 (two) times daily. Nutralite doubleX multivitamin pack    . NON FORMULARY Take 1 tablet by mouth 2 (two) times daily. Garlic-64m, peppermint leaf- 3103m [id number] garlic heart care tab    . NON FORMULARY Take 1 tablet by mouth 2 (two) times daily. Joint health + vit C    . ondansetron (ZOFRAN) 8 MG tablet Take 1 tablet (8 mg total) by mouth every 8 (eight) hours as needed for nausea or vomiting. 90 tablet 3  . promethazine (PHENERGAN) 25 MG tablet Take 1 tablet (25 mg total) by mouth every 6 (six) hours as needed for nausea or vomiting. 60 tablet 3  . scopolamine (TRANSDERM-SCOP) 1 MG/3DAYS Place 1 patch (1.5 mg total) onto the skin every 3 (three) days. 10 patch 12  . zolpidem (AMBIEN) 10 MG tablet Take 1 tablet (10 mg total) by mouth  at bedtime as needed for sleep. 20 tablet 0   No current facility-administered medications for this visit.   Facility-Administered Medications Ordered in Other Visits  Medication Dose Route Frequency Provider Last Rate Last Dose  . bleomycin (BLEOCIN) 15 Units in sodium chloride 0.9 % 50 mL chemo infusion  9.6 Units/m2  (Treatment Plan Actual) Intravenous Once Heath Lark, MD      . dacarbazine (DTIC) 600 mg in sodium chloride 0.9 % 250 mL chemo infusion  375 mg/m2 (Treatment Plan Actual) Intravenous Once Heath Lark, MD      . DOXOrubicin (ADRIAMYCIN) chemo injection 40 mg  25 mg/m2 (Treatment Plan Actual) Intravenous Once Heath Lark, MD      . fosaprepitant (EMEND) 150 mg, dexamethasone (DECADRON) 12 mg in sodium chloride 0.9 % 145 mL IVPB   Intravenous Once Heath Lark, MD      . heparin lock flush 100 unit/mL  500 Units Intracatheter Once PRN Heath Lark, MD      . sodium chloride 0.9 % injection 10 mL  10 mL Intracatheter PRN Demontre Padin, MD      . vinBLAStine (VELBAN) 10 mg in sodium chloride 0.9 % 50 mL chemo infusion  6.3 mg/m2 (Treatment Plan Actual) Intravenous Once Heath Lark, MD        PHYSICAL EXAMINATION: ECOG PERFORMANCE STATUS: 1 - Symptomatic but completely ambulatory  Filed Vitals:   05/29/15 1124  BP: 119/71  Pulse: 96  Temp: 98 F (36.7 C)  Resp: 18   Filed Weights   05/29/15 1124  Weight: 130 lb (58.968 kg)    GENERAL:alert, no distress and comfortable SKIN: skin color, texture, turgor are normal, no rashes or significant lesions EYES: normal, Conjunctiva are pink and non-injected, sclera clear OROPHARYNX:no exudate, no erythema and lips, buccal mucosa, and tongue normal  NECK: supple, thyroid normal size, non-tender, without nodularity LYMPH:  no palpable lymphadenopathy in the cervical, axillary or inguinal. She has persistent lump in the left supraclavicular region consistent with prior scarring LUNGS: clear to auscultation and percussion with normal breathing effort HEART: regular rate & rhythm and no murmurs and no lower extremity edema ABDOMEN:abdomen soft, non-tender and normal bowel sounds Musculoskeletal:no cyanosis of digits and no clubbing  NEURO: alert & oriented x 3 with fluent speech, no focal motor/sensory deficits  LABORATORY DATA:  I have reviewed the data as  listed    Component Value Date/Time   NA 141 05/29/2015 1104   NA 139 02/28/2015 0740   K 3.5 05/29/2015 1104   K 3.4* 02/28/2015 0740   CL 106 02/28/2015 0740   CO2 23 05/29/2015 1104   CO2 22 02/28/2015 0740   GLUCOSE 141* 05/29/2015 1104   GLUCOSE 98 02/28/2015 0740   BUN 7.2 05/29/2015 1104   BUN 13 02/28/2015 0740   CREATININE 0.7 05/29/2015 1104   CREATININE 0.66 02/28/2015 0740   CALCIUM 8.9 05/29/2015 1104   CALCIUM 8.9 02/28/2015 0740   PROT 6.5 05/29/2015 1104   PROT 7.4 02/28/2015 0740   ALBUMIN 3.9 05/29/2015 1104   ALBUMIN 3.5 02/28/2015 0740   AST 18 05/29/2015 1104   AST 22 02/28/2015 0740   ALT 16 05/29/2015 1104   ALT 16 02/28/2015 0740   ALKPHOS 51 05/29/2015 1104   ALKPHOS 69 02/28/2015 0740   BILITOT 0.54 05/29/2015 1104   BILITOT 0.6 02/28/2015 0740   GFRNONAA >60 02/28/2015 0740   GFRAA >60 02/28/2015 0740    No results found for: SPEP, UPEP  Lab Results  Component  Value Date   WBC 2.0* 05/29/2015   NEUTROABS 0.3* 05/29/2015   HGB 12.1 05/29/2015   HCT 34.9 05/29/2015   MCV 90.6 05/29/2015   PLT 254 05/29/2015      Chemistry      Component Value Date/Time   NA 141 05/29/2015 1104   NA 139 02/28/2015 0740   K 3.5 05/29/2015 1104   K 3.4* 02/28/2015 0740   CL 106 02/28/2015 0740   CO2 23 05/29/2015 1104   CO2 22 02/28/2015 0740   BUN 7.2 05/29/2015 1104   BUN 13 02/28/2015 0740   CREATININE 0.7 05/29/2015 1104   CREATININE 0.66 02/28/2015 0740      Component Value Date/Time   CALCIUM 8.9 05/29/2015 1104   CALCIUM 8.9 02/28/2015 0740   ALKPHOS 51 05/29/2015 1104   ALKPHOS 69 02/28/2015 0740   AST 18 05/29/2015 1104   AST 22 02/28/2015 0740   ALT 16 05/29/2015 1104   ALT 16 02/28/2015 0740   BILITOT 0.54 05/29/2015 1104   BILITOT 0.6 02/28/2015 0740      ASSESSMENT & PLAN:  Hodgkin lymphoma I have a long discussion with the patient. I presented her case at the last ENT tumor board related to the role of radiation  treatment. The patient has made an informed decision not to pursue radiation therapy. She is willing to proceed with more chemotherapy. Her only complication so far is profound nausea or vomiting. Since I started her on prescription scopolamine and Synthroid treatment, that has improved. We will proceed with last treatment today and I plan to restage her with another PET/CT scan in 2 months. I will also give her Lupron to reduce the risk of infertility.  Leukopenia due to antineoplastic chemotherapy This is likely due to recent treatment. The patient denies recent history of fevers, cough, chills, diarrhea or dysuria. She is asymptomatic from the leukopenia. I will observe for now.     At risk for fertility problems We discussed fertility preservation. I will order Lupron every 4 weeks, due today     Chemotherapy-induced nausea The patient was given Aloxi and Emend and yet still have persistent nausea. I recommend she continues on scopolamine patch along with dexamethasone for 2 days after each round of chemotherapy       Insomnia The patient has significant insomnia. I recommend a trial of temazepam.     Orders Placed This Encounter  Procedures  . NM PET Image Restag (PS) Skull Base To Thigh    Standing Status: Future     Number of Occurrences:      Standing Expiration Date: 07/28/2016    Order Specific Question:  Reason for Exam (SYMPTOM  OR DIAGNOSIS REQUIRED)    Answer:  staging lymphoma assess response to Rx    Order Specific Question:  Is the patient pregnant?    Answer:  No    Order Specific Question:  Preferred imaging location?    Answer:  [facility]   All questions were answered. The patient knows to call the clinic with any problems, questions or concerns. No barriers to learning was detected. I spent 25 minutes counseling the patient face to face. The total time spent in the appointment was 40 minutes and more than 50% was on counseling and  review of test results     [facility], Sota Hetz, MD 05/29/2015 1:12 PM

## 2015-05-29 NOTE — Assessment & Plan Note (Signed)
I have a long discussion with the patient. I presented her case at the last ENT tumor board related to the role of radiation treatment. The patient has made an informed decision not to pursue radiation therapy. She is willing to proceed with more chemotherapy. Her only complication so far is profound nausea or vomiting. Since I started her on prescription scopolamine and Synthroid treatment, that has improved. We will proceed with last treatment today and I plan to restage her with another PET/CT scan in 2 months. I will also give her Lupron to reduce the risk of infertility.

## 2015-05-29 NOTE — Assessment & Plan Note (Signed)
We discussed fertility preservation. I will order Lupron every 4 weeks, due today

## 2015-05-29 NOTE — Patient Instructions (Signed)
[facility] Discharge Instructions for Patients Receiving Chemotherapy  Today you received the following chemotherapy agents Adriamycin, bleomycin, velban, dacarbazine.  To help prevent nausea and vomiting after your treatment, we encourage you to take your nausea medication  As prescribed. If you develop nausea and vomiting that is not controlled by your nausea medication, call the clinic.   BELOW ARE SYMPTOMS THAT SHOULD BE REPORTED IMMEDIATELY:  *FEVER GREATER THAN 100.5 F  *CHILLS WITH OR WITHOUT FEVER  NAUSEA AND VOMITING THAT IS NOT CONTROLLED WITH YOUR NAUSEA MEDICATION  *UNUSUAL SHORTNESS OF BREATH  *UNUSUAL BRUISING OR BLEEDING  TENDERNESS IN MOUTH AND THROAT WITH OR WITHOUT PRESENCE OF ULCERS  *URINARY PROBLEMS  *BOWEL PROBLEMS  UNUSUAL RASH Items with * indicate a potential emergency and should be followed up as soon as possible.  Feel free to call the clinic you have any questions or concerns. The clinic phone number is [phone].  Please show the CHEMO ALERT CARD at check-in to the Emergency Department and triage nurse.   

## 2015-05-29 NOTE — Telephone Encounter (Signed)
Gave and printed appt sched and avs for NOV °

## 2015-05-29 NOTE — Assessment & Plan Note (Signed)
The patient was given Aloxi and Emend and yet still have persistent nausea. I recommend she continues on scopolamine patch along with dexamethasone for 2 days after each round of chemotherapy

## 2015-05-29 NOTE — Assessment & Plan Note (Signed)
This is likely due to recent treatment. The patient denies recent history of fevers, cough, chills, diarrhea or dysuria. She is asymptomatic from the leukopenia. I will observe for now.    

## 2015-05-29 NOTE — Assessment & Plan Note (Signed)
The patient has significant insomnia. I recommend a trial of temazepam.

## 2015-05-29 NOTE — Patient Instructions (Signed)

## 2015-06-04 ENCOUNTER — Ambulatory Visit: Payer: BLUE CROSS/BLUE SHIELD

## 2015-06-11 ENCOUNTER — Other Ambulatory Visit: Payer: BLUE CROSS/BLUE SHIELD

## 2015-06-12 ENCOUNTER — Encounter: Payer: BLUE CROSS/BLUE SHIELD | Admitting: Nutrition

## 2015-06-12 ENCOUNTER — Ambulatory Visit: Payer: BLUE CROSS/BLUE SHIELD

## 2015-06-14 ENCOUNTER — Telehealth: Payer: Self-pay | Admitting: Hematology and Oncology

## 2015-06-14 NOTE — Telephone Encounter (Signed)
PT CALLED TO CONFIRM APPT...DONE....PT OK AND AWARE

## 2015-06-25 ENCOUNTER — Other Ambulatory Visit: Payer: BLUE CROSS/BLUE SHIELD

## 2015-07-09 ENCOUNTER — Other Ambulatory Visit: Payer: BLUE CROSS/BLUE SHIELD

## 2015-07-26 ENCOUNTER — Other Ambulatory Visit: Payer: Self-pay | Admitting: Hematology and Oncology

## 2015-07-26 DIAGNOSIS — C8171 Other classical Hodgkin lymphoma, lymph nodes of head, face, and neck: Secondary | ICD-10-CM

## 2015-07-29 ENCOUNTER — Ambulatory Visit (HOSPITAL_BASED_OUTPATIENT_CLINIC_OR_DEPARTMENT_OTHER): Payer: BLUE CROSS/BLUE SHIELD

## 2015-07-29 ENCOUNTER — Ambulatory Visit (HOSPITAL_COMMUNITY): Payer: BLUE CROSS/BLUE SHIELD

## 2015-07-29 ENCOUNTER — Other Ambulatory Visit (HOSPITAL_BASED_OUTPATIENT_CLINIC_OR_DEPARTMENT_OTHER): Payer: BLUE CROSS/BLUE SHIELD

## 2015-07-29 ENCOUNTER — Ambulatory Visit (HOSPITAL_COMMUNITY)
Admission: RE | Admit: 2015-07-29 | Discharge: 2015-07-29 | Disposition: A | Discharge status: Home / Self Care | Payer: BLUE CROSS/BLUE SHIELD | Source: Ambulatory Visit | Attending: Hematology and Oncology | Admitting: Hematology and Oncology

## 2015-07-29 VITALS — BP 118/79 | HR 81 | Temp 98.1°F | Resp 18

## 2015-07-29 DIAGNOSIS — Z452 Encounter for adjustment and management of vascular access device: Secondary | ICD-10-CM

## 2015-07-29 DIAGNOSIS — C8171 Other classical Hodgkin lymphoma, lymph nodes of head, face, and neck: Secondary | ICD-10-CM

## 2015-07-29 DIAGNOSIS — C8190 Hodgkin lymphoma, unspecified, unspecified site: Secondary | ICD-10-CM

## 2015-07-29 LAB — CBC WITH DIFFERENTIAL/PLATELET
BASO%: 0.4 % (ref 0.0–2.0)
Basophils Absolute: 0 10*3/uL (ref 0.0–0.1)
EOS%: 0.9 % (ref 0.0–7.0)
Eosinophils Absolute: 0.1 10*3/uL (ref 0.0–0.5)
HCT: 39.5 % (ref 34.8–46.6)
HEMOGLOBIN: 13.3 g/dL (ref 11.6–15.9)
LYMPH%: 20.1 % (ref 14.0–49.7)
MCH: 31.6 pg (ref 25.1–34.0)
MCHC: 33.6 g/dL (ref 31.5–36.0)
MCV: 94.1 fL (ref 79.5–101.0)
MONO#: 0.4 10*3/uL (ref 0.1–0.9)
MONO%: 7.3 % (ref 0.0–14.0)
NEUT%: 71.3 % (ref 38.4–76.8)
NEUTROS ABS: 4 10*3/uL (ref 1.5–6.5)
Platelets: 224 10*3/uL (ref 145–400)
RBC: 4.2 10*6/uL (ref 3.70–5.45)
RDW: 14.1 % (ref 11.2–14.5)
WBC: 5.6 10*3/uL (ref 3.9–10.3)
lymph#: 1.1 10*3/uL (ref 0.9–3.3)

## 2015-07-29 LAB — COMPREHENSIVE METABOLIC PANEL (CC13)
ALT: 13 U/L (ref 0–55)
AST: 17 U/L (ref 5–34)
Albumin: 3.9 g/dL (ref 3.5–5.0)
Alkaline Phosphatase: 58 U/L (ref 40–150)
Anion Gap: 10 mEq/L (ref 3–11)
BILIRUBIN TOTAL: 0.63 mg/dL (ref 0.20–1.20)
BUN: 6.9 mg/dL — AB (ref 7.0–26.0)
CO2: 21 meq/L — AB (ref 22–29)
Calcium: 8.8 mg/dL (ref 8.4–10.4)
Chloride: 110 mEq/L — ABNORMAL HIGH (ref 98–109)
Creatinine: 0.6 mg/dL (ref 0.6–1.1)
GLUCOSE: 86 mg/dL (ref 70–140)
Potassium: 4 mEq/L (ref 3.5–5.1)
SODIUM: 140 meq/L (ref 136–145)
TOTAL PROTEIN: 6.6 g/dL (ref 6.4–8.3)

## 2015-07-29 MED ORDER — HEPARIN SOD (PORK) LOCK FLUSH 100 UNIT/ML IV SOLN
500.0000 [IU] | Freq: Once | INTRAVENOUS | Status: AC
Start: 2015-07-29 — End: 2015-07-29
  Administered 2015-07-29: 500 [IU]
  Filled 2015-07-29: qty 5

## 2015-07-29 MED ORDER — SODIUM CHLORIDE 0.9 % IJ SOLN
10.0000 mL | Freq: Once | INTRAMUSCULAR | Status: AC
  Administered 2015-07-29: 10 mL
  Filled 2015-07-29: qty 10

## 2015-07-29 MED ORDER — IOHEXOL 300 MG/ML  SOLN
75.0000 mL | Freq: Once | INTRAMUSCULAR | Status: AC | PRN
Start: 2015-07-29 — End: 2015-07-29
  Administered 2015-07-29: 75 mL via INTRAVENOUS

## 2015-07-30 ENCOUNTER — Telehealth: Payer: Self-pay | Admitting: Hematology and Oncology

## 2015-07-30 ENCOUNTER — Encounter: Payer: Self-pay | Admitting: Hematology and Oncology

## 2015-07-30 ENCOUNTER — Ambulatory Visit (HOSPITAL_BASED_OUTPATIENT_CLINIC_OR_DEPARTMENT_OTHER): Payer: BLUE CROSS/BLUE SHIELD | Admitting: Hematology and Oncology

## 2015-07-30 VITALS — BP 104/75 | HR 75 | Temp 97.4°F | Resp 20 | Ht 60.0 in | Wt 125.7 lb

## 2015-07-30 DIAGNOSIS — C8111 Nodular sclerosis classical Hodgkin lymphoma, lymph nodes of head, face, and neck: Secondary | ICD-10-CM

## 2015-07-30 DIAGNOSIS — T792XXS Traumatic secondary and recurrent hemorrhage and seroma, sequela: Secondary | ICD-10-CM

## 2015-07-30 DIAGNOSIS — Z9189 Other specified personal risk factors, not elsewhere classified: Secondary | ICD-10-CM

## 2015-07-30 DIAGNOSIS — Z23 Encounter for immunization: Secondary | ICD-10-CM | POA: Diagnosis not present

## 2015-07-30 DIAGNOSIS — T888XXS Other specified complications of surgical and medical care, not elsewhere classified, sequela: Secondary | ICD-10-CM

## 2015-07-30 DIAGNOSIS — IMO0002 Reserved for concepts with insufficient information to code with codable children: Secondary | ICD-10-CM | POA: Insufficient documentation

## 2015-07-30 DIAGNOSIS — C8170 Other classical Hodgkin lymphoma, unspecified site: Secondary | ICD-10-CM

## 2015-07-30 LAB — PREGNANCY, URINE: Preg Test, Ur: NEGATIVE

## 2015-07-30 MED ORDER — INFLUENZA VAC SPLIT QUAD 0.5 ML IM SUSY
0.5000 mL | PREFILLED_SYRINGE | Freq: Once | INTRAMUSCULAR | Status: AC
  Administered 2015-07-30: 0.5 mL via INTRAMUSCULAR
  Filled 2015-07-30: qty 0.5

## 2015-07-30 NOTE — Telephone Encounter (Signed)
s.w. pt and confirm Feb 2017 appt....pt ok and aware

## 2015-07-30 NOTE — Assessment & Plan Note (Signed)
I reviewed the NCCN guidelines. We have completed 3 cycles of treatment with near complete response on imaging study. I will discontinue treatment and recommend port removal. Per guidelines, I will see her back in 3 months with repeat history, physical examination and blood work. I will repeat imaging again in 6 months. We discussed cancer survivorship issues. I recommend influenza vaccination. We discussed the importance of preventive care and reviewed the vaccination programs. She does not have any prior allergic reactions to influenza vaccination. She agrees to proceed with influenza vaccination today and we will administer it today at the clinic.

## 2015-07-30 NOTE — Assessment & Plan Note (Signed)
Hot flashes has resolved. Her menstruation has not returned yet but we will monitor closely. I recommend she resume contraceptive with nuva ring moving forward

## 2015-07-30 NOTE — Progress Notes (Signed)
[place] OFFICE PROGRESS NOTE  Patient Care Team: No Pcp Per Patient as PCP - General (General Practice) Mallory Gala, MD as Consulting Physician (Otolaryngology)  SUMMARY OF ONCOLOGIC HISTORY:   Hodgkin lymphoma ([place])   02/08/2015 Surgery she underwent excisional lymph node biopsy of the left supraclavicular mass   02/08/2015 Pathology Results Accession: [id number] Biopsy showed Classical Hodgkin Lymphoma   02/22/2015 Imaging ECHO is normal   02/25/2015 Procedure She has port placement   02/26/2015 Miscellaneous PFT is normal   02/28/2015 Bone Marrow Biopsy Accession: [id number] BM biopsy was negative   03/01/2015 - 05/29/2015 Chemotherapy She received ABVD x 3 cycles   04/25/2015 Imaging  PET CT scan showed complete response to treatment.   07/29/2015 Imaging CT scan showed positive response to Rx with regression in size of mediastinal LN    INTERVAL HISTORY: Please see below for problem oriented charting. She returns for further follow-up. She feels well. Denies recent infection. No new lymphadenopathy. She have persistent fullness in the left supraclavicular region but it does not bother her. Hot flashes have resolved. She has not had menstruation yet. She has very mild nausea but no vomiting.  REVIEW OF SYSTEMS:   Constitutional: Denies fevers, chills or abnormal weight loss Eyes: Denies blurriness of vision Ears, nose, mouth, throat, and face: Denies mucositis or sore throat Respiratory: Denies cough, dyspnea or wheezes Cardiovascular: Denies palpitation, chest discomfort or lower extremity swelling Skin: Denies abnormal skin rashes Lymphatics: Denies new lymphadenopathy or easy bruising Neurological:Denies numbness, tingling or new weaknesses Behavioral/Psych: Mood is stable, no new changes  All other systems were reviewed with the patient and are negative.  I have reviewed the past medical history, past surgical history, social history and family history with the  patient and they are unchanged from previous note.  ALLERGIES:  has No Known Allergies.  MEDICATIONS:  Current Outpatient Prescriptions  Medication Sig Dispense Refill  . etonogestrel-ethinyl estradiol (NUVARING) 0.12-0.015 MG/24HR vaginal ring Place 1 each vaginally every 28 (twenty-eight) days. Insert vaginally and leave in place for 3 consecutive weeks, then remove for 1 week.    . NON FORMULARY Take 1 tablet by mouth 2 (two) times daily. Balance health omega    . NON FORMULARY Take 3 tablets by mouth 2 (two) times daily. Nutralite doubleX multivitamin pack    . NON FORMULARY Take 1 tablet by mouth 2 (two) times daily. Garlic-633m, peppermint leaf- 325m [id number] garlic heart care tab    . NON FORMULARY Take 1 tablet by mouth 2 (two) times daily. Joint health + vit C    . Fiber POWD Take 4 g by mouth 2 (two) times daily.    . Marland Kitchenolpidem (AMBIEN) 10 MG tablet Take 1 tablet (10 mg total) by mouth at bedtime as needed for sleep. 20 tablet 0   No current facility-administered medications for this visit.    PHYSICAL EXAMINATION: ECOG PERFORMANCE STATUS: 0 - Asymptomatic  Filed Vitals:   07/30/15 1115  BP: 104/75  Pulse: 75  Temp: 97.4 F (36.3 C)  Resp: 20   Filed Weights   07/30/15 1115  Weight: 125 lb 11.2 oz (57.017 kg)    GENERAL:alert, no distress and comfortable SKIN: skin color, texture, turgor are normal, no rashes or significant lesions EYES: normal, Conjunctiva are pink and non-injected, sclera clear OROPHARYNX:no exudate, no erythema and lips, buccal mucosa, and tongue normal  Musculoskeletal:no cyanosis of digits and no clubbing  NEURO: alert & oriented x 3 with fluent speech,  no focal motor/sensory deficits  LABORATORY DATA:  I have reviewed the data as listed    Component Value Date/Time   NA 140 07/29/2015 1118   NA 139 02/28/2015 0740   K 4.0 07/29/2015 1118   K 3.4* 02/28/2015 0740   CL 106 02/28/2015 0740   CO2 21* 07/29/2015 1118    CO2 22 02/28/2015 0740   GLUCOSE 86 07/29/2015 1118   GLUCOSE 98 02/28/2015 0740   BUN 6.9* 07/29/2015 1118   BUN 13 02/28/2015 0740   CREATININE 0.6 07/29/2015 1118   CREATININE 0.66 02/28/2015 0740   CALCIUM 8.8 07/29/2015 1118   CALCIUM 8.9 02/28/2015 0740   PROT 6.6 07/29/2015 1118   PROT 7.4 02/28/2015 0740   ALBUMIN 3.9 07/29/2015 1118   ALBUMIN 3.5 02/28/2015 0740   AST 17 07/29/2015 1118   AST 22 02/28/2015 0740   ALT 13 07/29/2015 1118   ALT 16 02/28/2015 0740   ALKPHOS 58 07/29/2015 1118   ALKPHOS 69 02/28/2015 0740   BILITOT 0.63 07/29/2015 1118   BILITOT 0.6 02/28/2015 0740   GFRNONAA >60 02/28/2015 0740   GFRAA >60 02/28/2015 0740    No results found for: SPEP, UPEP  Lab Results  Component Value Date   WBC 5.6 07/29/2015   NEUTROABS 4.0 07/29/2015   HGB 13.3 07/29/2015   HCT 39.5 07/29/2015   MCV 94.1 07/29/2015   PLT 224 07/29/2015      Chemistry      Component Value Date/Time   NA 140 07/29/2015 1118   NA 139 02/28/2015 0740   K 4.0 07/29/2015 1118   K 3.4* 02/28/2015 0740   CL 106 02/28/2015 0740   CO2 21* 07/29/2015 1118   CO2 22 02/28/2015 0740   BUN 6.9* 07/29/2015 1118   BUN 13 02/28/2015 0740   CREATININE 0.6 07/29/2015 1118   CREATININE 0.66 02/28/2015 0740      Component Value Date/Time   CALCIUM 8.8 07/29/2015 1118   CALCIUM 8.9 02/28/2015 0740   ALKPHOS 58 07/29/2015 1118   ALKPHOS 69 02/28/2015 0740   AST 17 07/29/2015 1118   AST 22 02/28/2015 0740   ALT 13 07/29/2015 1118   ALT 16 02/28/2015 0740   BILITOT 0.63 07/29/2015 1118   BILITOT 0.6 02/28/2015 0740       RADIOGRAPHIC STUDIES: I have personally reviewed the radiological images as listed and agreed with the findings in the report. Ct Soft Tissue Neck W Contrast  07/29/2015  CLINICAL DATA:  Hodgkin's lymphoma. Staging. The patient is status post LEFT supraclavicular lymph node excision in toto 02/08/15. EXAM: CT NECK WITH CONTRAST TECHNIQUE: Multidetector CT imaging  of the neck was performed using the standard protocol following the bolus administration of intravenous contrast. CONTRAST:  61m OMNIPAQUE IOHEXOL 300 MG/ML  SOLN COMPARISON:  01/31/2015 CT. 04/25/2015 CT. PET scan 02/26/2015. PET scan 04/25/2015. FINDINGS: No pathologic adenopathy remains in the neck. Redemonstrated is a 26 x 19 mm hypoattenuating fluid collection at the site of previous lymph node excision. This is decreased in size compared with the most recent formal CT of 02/26/2015 where it measured 44 x 28 mm. It is similar in size to the appearance on most recent PET CT with measurements of 27 x 24 mm cross-section. Some areas measure up to 15 Hounsfield units and some areas have a negative Hounsfield attenuation -10 to -20. Concern raised for postsurgical thoracic duct injury. No other significant findings. No osseous abnormality. CT chest reported separately. Normal thyroid gland. Airway midline.  Negative intracranial contents. No sinus or mastoid disease. IMPRESSION: Persistent and abnormal hypoattenuating fluid collection in the LEFT supraclavicular region site of previous lymph node excision. Some fluid within this lesion appears to have a negative Hounsfield attenuation. Concern raised for thoracic duct injury. Other consideration would include postoperative seroma. Necrotic lymph node is not consistent with the operative note which stated complete excision of the LEFT node. A Fluid aspirate could be helpful in assessing the presence or absence of chyle. Electronically Signed   By: Staci Righter M.D.   On: 07/29/2015 13:30   Ct Chest W Contrast  07/29/2015  CLINICAL DATA:  Subsequent encounter for non-Hodgkin's lymphoma EXAM: CT CHEST WITH CONTRAST TECHNIQUE: Multidetector CT imaging of the chest was performed during intravenous contrast administration. CONTRAST:  72m OMNIPAQUE IOHEXOL 300 MG/ML  SOLN COMPARISON:  CT chest 01/31/2015.  PET-CT from 6/7 04/25/2015. FINDINGS: Mediastinum / Lymph  Nodes: Right Port-A-Cath tip is in the mid right atrium. There is no axillary lymphadenopathy. B left supraclavicular lesion appears cystic and measures 2.1 cm today compared to 2.2 cm previously. This lesion was not hypermetabolic on the previous PET studies. The anterior mediastinal lesion measures 1.7 x 3.5 cm today compared to 2.2 x 4.2 cm on 04/25/2015. Right paratracheal lymph node on image 17 series 2 today is 6 mm short axis compared to 9 mm short axis when I remeasure on 04/25/2015. No new or progressive mediastinal lymphadenopathy. The heart size is normal. No pericardial effusion. The esophagus has normal CT imaging features. Lungs / Pleura: Lungs are clear without focal airspace consolidation. No pulmonary edema or pleural effusion. Upper Abdomen: Visualized portions of the liver and spleen are unremarkable. No adrenal nodule or mass. MSK / Soft Tissues: Bone windows reveal no worrisome lytic or sclerotic osseous lesions. Segmentation anomaly at T10 again noted. IMPRESSION: 1. Interval continued decrease in size of the previously characterized mediastinal lesions. No new or progressive lymphadenopathy in the chest. 2. Cystic lesion in the left supraclavicular space measures slightly smaller and may be postsurgical. This lesion was not hypermetabolic on previous PET imaging. 3. T10 segmentation anomaly. Electronically Signed   By: EMisty StanleyM.D.   On: 07/29/2015 12:16     ASSESSMENT & PLAN:  Hodgkin lymphoma I reviewed the NCCN guidelines. We have completed 3 cycles of treatment with near complete response on imaging study. I will discontinue treatment and recommend port removal. Per guidelines, I will see her back in 3 months with repeat history, physical examination and blood work. I will repeat imaging again in 6 months. We discussed cancer survivorship issues. I recommend influenza vaccination. We discussed the importance of preventive care and reviewed the vaccination programs. She  does not have any prior allergic reactions to influenza vaccination. She agrees to proceed with influenza vaccination today and we will administer it today at the clinic.   At risk for fertility problems Hot flashes has resolved. Her menstruation has not returned yet but we will monitor closely. I recommend she resume contraceptive with nuva ring moving forward  Postoperative seroma She has persistent postoperative seroma. It does not bother her. I recommend she discuss with her surgeon about future treatment options. At present time, I recommend observation only.   Orders Placed This Encounter  Procedures  . IR Removal Tun Access W/ Port W/O FL    Standing Status: Future     Number of Occurrences:      Standing Expiration Date: 09/28/2016    Order Specific Question:  Reason  for exam:    Answer:  no longer need chemo    Order Specific Question:  Is the patient pregnant?    Answer:  No    Order Specific Question:  Preferred Imaging Location?    Answer:  [facility]   All questions were answered. The patient knows to call the clinic with any problems, questions or concerns. No barriers to learning was detected. I spent 20 minutes counseling the patient face to face. The total time spent in the appointment was 25 minutes and more than 50% was on counseling and review of test results     [facility], [place], MD 07/30/2015 11:52 AM

## 2015-07-30 NOTE — Assessment & Plan Note (Signed)
She has persistent postoperative seroma. It does not bother her. I recommend she discuss with her surgeon about future treatment options. At present time, I recommend observation only.

## 2015-08-05 ENCOUNTER — Telehealth: Payer: Self-pay | Admitting: *Deleted

## 2015-08-05 NOTE — Telephone Encounter (Signed)
Call received in [place] from pt stating she will be having port removed this Friday.  Mallory Price is requesting Dr Alvy Bimler to prescribe " something for pain so I can use it if needed after the port is removed "  Mallory Price stated she had significant pain when port was inserted and is anxious about the removal- and going thru the weekend without good pain control.  Return call number for Mallory Price given as [phone].  This note will be sent to MD and nurse for appropriate recommendation and return call to pt.

## 2015-08-06 ENCOUNTER — Telehealth: Payer: Self-pay | Admitting: *Deleted

## 2015-08-06 ENCOUNTER — Other Ambulatory Visit: Payer: Self-pay | Admitting: Hematology and Oncology

## 2015-08-06 ENCOUNTER — Other Ambulatory Visit: Payer: Self-pay | Admitting: Radiology

## 2015-08-06 MED ORDER — OXYCODONE-ACETAMINOPHEN 5-325 MG PO TABS: 1.0000 | ORAL_TABLET | ORAL | Status: DC | PRN

## 2015-08-06 NOTE — Telephone Encounter (Signed)
Informed pt of Rx Percocet ready to pick up,  She requested Rx to have for pain post PAC removal if needed.  Pt verbalized understanding.

## 2015-08-06 NOTE — Telephone Encounter (Signed)
I printed a prescription just in case Please reassure her taking it out is very straightforward and should not caused too much pain

## 2015-08-08 ENCOUNTER — Other Ambulatory Visit: Payer: Self-pay | Admitting: Radiology

## 2015-08-09 ENCOUNTER — Ambulatory Visit (HOSPITAL_COMMUNITY)
Admission: RE | Admit: 2015-08-09 | Discharge: 2015-08-09 | Disposition: A | Discharge status: Home / Self Care | Payer: BLUE CROSS/BLUE SHIELD | Source: Ambulatory Visit | Attending: Hematology and Oncology | Admitting: Hematology and Oncology

## 2015-08-09 ENCOUNTER — Encounter (HOSPITAL_COMMUNITY): Payer: Self-pay

## 2015-08-09 DIAGNOSIS — Z452 Encounter for adjustment and management of vascular access device: Secondary | ICD-10-CM | POA: Diagnosis present

## 2015-08-09 DIAGNOSIS — Z8571 Personal history of Hodgkin lymphoma: Secondary | ICD-10-CM | POA: Diagnosis not present

## 2015-08-09 DIAGNOSIS — C8111 Nodular sclerosis classical Hodgkin lymphoma, lymph nodes of head, face, and neck: Secondary | ICD-10-CM

## 2015-08-09 LAB — APTT: aPTT: 27 seconds (ref 24–37)

## 2015-08-09 LAB — BASIC METABOLIC PANEL
ANION GAP: 6 (ref 5–15)
BUN: 11 mg/dL (ref 6–20)
CHLORIDE: 108 mmol/L (ref 101–111)
CO2: 24 mmol/L (ref 22–32)
Calcium: 9 mg/dL (ref 8.9–10.3)
Creatinine, Ser: 0.6 mg/dL (ref 0.44–1.00)
GFR calc non Af Amer: >60 mL/min (ref >60)
Glucose, Bld: 90 mg/dL (ref 65–99)
POTASSIUM: 3.6 mmol/L (ref 3.5–5.1)
SODIUM: 138 mmol/L (ref 135–145)

## 2015-08-09 LAB — CBC
HEMATOCRIT: 39.6 % (ref 36.0–46.0)
HEMOGLOBIN: 13.5 g/dL (ref 12.0–15.0)
MCH: 31.9 pg (ref 26.0–34.0)
MCHC: 34.1 g/dL (ref 30.0–36.0)
MCV: 93.6 fL (ref 78.0–100.0)
PLATELETS: 265 10*3/uL (ref 150–400)
RBC: 4.23 MIL/uL (ref 3.87–5.11)
RDW: 13.9 % (ref 11.5–15.5)
WBC: 4.2 10*3/uL (ref 4.0–10.5)

## 2015-08-09 LAB — PROTIME-INR
INR: 0.92 (ref 0.00–1.49)
Prothrombin Time: 12.6 seconds (ref 11.6–15.2)

## 2015-08-09 MED ORDER — MIDAZOLAM HCL 2 MG/2ML IJ SOLN
INTRAMUSCULAR | Status: AC
  Filled 2015-08-09: qty 4

## 2015-08-09 MED ORDER — LIDOCAINE-EPINEPHRINE 2 %-1:100000 IJ SOLN
INTRAMUSCULAR | Status: AC
  Filled 2015-08-09: qty 1

## 2015-08-09 MED ORDER — FENTANYL CITRATE (PF) 100 MCG/2ML IJ SOLN
INTRAMUSCULAR | Status: AC | PRN
  Administered 2015-08-09 (×2): 25 ug via INTRAVENOUS
  Administered 2015-08-09: 50 ug via INTRAVENOUS

## 2015-08-09 MED ORDER — DIPHENHYDRAMINE HCL 25 MG PO CAPS
ORAL_CAPSULE | ORAL | Status: AC
  Filled 2015-08-09: qty 1

## 2015-08-09 MED ORDER — CEFAZOLIN SODIUM-DEXTROSE 2-3 GM-% IV SOLR
INTRAVENOUS | Status: AC
  Filled 2015-08-09: qty 50

## 2015-08-09 MED ORDER — CEFAZOLIN SODIUM-DEXTROSE 2-3 GM-% IV SOLR
2.0000 g | INTRAVENOUS | Status: AC
  Administered 2015-08-09: 2 g via INTRAVENOUS

## 2015-08-09 MED ORDER — MIDAZOLAM HCL 2 MG/2ML IJ SOLN
INTRAMUSCULAR | Status: AC | PRN
  Administered 2015-08-09 (×4): 1 mg via INTRAVENOUS

## 2015-08-09 MED ORDER — DIPHENHYDRAMINE HCL 25 MG PO CAPS
25.0000 mg | ORAL_CAPSULE | Freq: Once | ORAL | Status: AC
  Administered 2015-08-09: 25 mg via ORAL

## 2015-08-09 MED ORDER — SODIUM CHLORIDE 0.9 % IV SOLN
Freq: Once | INTRAVENOUS | Status: AC
  Administered 2015-08-09: 12:00:00 via INTRAVENOUS

## 2015-08-09 MED ORDER — FENTANYL CITRATE (PF) 100 MCG/2ML IJ SOLN
INTRAMUSCULAR | Status: AC
  Filled 2015-08-09: qty 2

## 2015-08-09 NOTE — Procedures (Signed)
Successful removal of right chest portacath.  No immediate complication. Minimal blood loss.

## 2015-08-09 NOTE — Discharge Instructions (Signed)
Incision Care °An incision is when a surgeon cuts into your body. After surgery, the incision needs to be cared for properly to prevent infection.  °HOW TO CARE FOR YOUR INCISION °· Take medicines only as directed by your health care provider. °· There are many different ways to close and cover an incision, including stitches, skin glue, and adhesive strips. Follow your health care provider's instructions on: °¨ Incision care. °¨ Bandage (dressing) changes and removal. °¨ Incision closure removal. °· Do not take baths, swim, or use a hot tub until your health care provider approves. You may shower as directed by your health care provider. °· Resume your normal diet and activities as directed. °· Use anti-itch medicine (such as an antihistamine) as directed by your health care provider. The incision may itch while it is healing. Do not pick or scratch at the incision. °· Drink enough fluid to keep your urine clear or pale yellow. °SEEK MEDICAL CARE IF:  °· You have drainage, redness, swelling, or pain at your incision site. °· You have muscle aches, chills, or a general ill feeling. °· You notice a bad smell coming from the incision or dressing. °· Your incision edges separate after the sutures, staples, or skin adhesive strips have been removed. °· You have persistent nausea or vomiting. °· You have a fever. °· You are dizzy. °SEEK IMMEDIATE MEDICAL CARE IF:  °· You have a rash. °· You faint. °· You have difficulty breathing. °MAKE SURE YOU:  °· Understand these instructions. °· Will watch your condition. °· Will get help right away if you are not doing well or get worse. °  °This information is not intended to replace advice given to you by your health care provider. Make sure you discuss any questions you have with your health care provider. °  °Document Released: 03/27/2005 Document Revised: 09/28/2014 Document Reviewed: 11/01/2013 °Elsevier Interactive Patient Education ©2016 [employer] ° ° °Moderate  Conscious Sedation, Adult, Care After °Refer to this sheet in the next few weeks. These instructions provide you with information on caring for yourself after your procedure. Your health care provider may also give you more specific instructions. Your treatment has been planned according to current medical practices, but problems sometimes occur. Call your health care provider if you have any problems or questions after your procedure. °WHAT TO EXPECT AFTER THE PROCEDURE  °After your procedure: °· You may feel sleepy, clumsy, and have poor balance for several hours. °· Vomiting may occur if you eat too soon after the procedure. °HOME CARE INSTRUCTIONS °· Do not participate in any activities where you could become injured for at least 24 hours. Do not: °¨ Drive. °¨ Swim. °¨ Ride a bicycle. °¨ Operate heavy machinery. °¨ Cook. °¨ Use power tools. °¨ Climb ladders. °¨ Work from a high place. °· Do not make important decisions or sign legal documents until you are improved. °· If you vomit, drink water, juice, or soup when you can drink without vomiting. Make sure you have little or no nausea before eating solid foods. °· Only take over-the-counter or prescription medicines for pain, discomfort, or fever as directed by your health care provider. °· Make sure you and your family fully understand everything about the medicines given to you, including what side effects may occur. °· You should not drink alcohol, take sleeping pills, or take medicines that cause drowsiness for at least 24 hours. °· If you smoke, do not smoke without supervision. °· If you are feeling   better, you may resume normal activities 24 hours after you were sedated. °· Keep all appointments with your health care provider. °SEEK MEDICAL CARE IF: °· Your skin is pale or bluish in color. °· You continue to feel nauseous or vomit. °· Your pain is getting worse and is not helped by medicine. °· You have bleeding or swelling. °· You are still sleepy or  feeling clumsy after 24 hours. °SEEK IMMEDIATE MEDICAL CARE IF: °· You develop a rash. °· You have difficulty breathing. °· You develop any type of allergic problem. °· You have a fever. °MAKE SURE YOU: °· Understand these instructions. °· Will watch your condition. °· Will get help right away if you are not doing well or get worse. °  °This information is not intended to replace advice given to you by your health care provider. Make sure you discuss any questions you have with your health care provider. °  °Document Released: 06/28/2013 Document Revised: 09/28/2014 Document Reviewed: 06/28/2013 °Elsevier Interactive Patient Education ©2016 Elsevier Inc. ° °

## 2015-08-09 NOTE — Progress Notes (Signed)
Patient ID: Mallory Price, female   DOB: Nov 19, 1990, 24 y.o.   MRN: [id number]    Referring Physician(s): Gorsuch,Ni  Chief Complaint: Hodgkin's lymphoma   Subjective: Patient familiar to IR service from prior right IJ Port-A-Cath placement on 02/25/15. She has known history of Hodgkin's lymphoma and has completed 3 cycles of treatment with near-complete response on imaging. Request now received for Port-A-Cath removal. She currently denies fever, chills, headache, chest pain, dyspnea, cough, abdominal or back pain, nausea/vomiting or abnormal bleeding.   Allergies: Review of patient's allergies indicates no known allergies.  Medications: Prior to Admission medications   Medication Sig Start Date End Date Taking? Authorizing Provider  etonogestrel-ethinyl estradiol (NUVARING) 0.12-0.015 MG/24HR vaginal ring Place 1 each vaginally every 28 (twenty-eight) days. Insert vaginally and leave in place for 3 consecutive weeks, then remove for 1 week.    Historical Provider, MD  Fiber POWD Take 4 g by mouth 2 (two) times daily.    Historical Provider, MD  NON FORMULARY Take 1 tablet by mouth 2 (two) times daily. Balance health omega    Historical Provider, MD  NON FORMULARY Take 3 tablets by mouth 2 (two) times daily. Nutralite doubleX multivitamin pack    Historical Provider, MD  NON FORMULARY Take 1 tablet by mouth 2 (two) times daily. Garlic-600mg , peppermint leaf- 30mg , alliin-13.5mg  allicin-6mg  = garlic heart care tab    Historical Provider, MD  NON FORMULARY Take 1 tablet by mouth 2 (two) times daily. Joint health + vit C    Historical Provider, MD  oxyCODONE-acetaminophen (PERCOCET/ROXICET) 5-325 MG tablet Take 1 tablet by mouth every 4 (four) hours as needed for severe pain. 08/06/15   08/19/15, MD  zolpidem (AMBIEN) 10 MG tablet Take 1 tablet (10 mg total) by mouth at bedtime as needed for sleep. 05/17/15 06/16/15  06/29/15, MD     Vital Signs: BP 108/68 mmHg  Pulse 78   Temp(Src) 97.4 F (36.3 C) (Oral)  Resp 16  Ht 5\' 1"  (1.549 m)  Wt 125 lb (56.7 kg)  BMI 23.63 kg/m2  SpO2 100%  Physical Exam patient awake alert. Chest clear to auscultation. Right IJ Port-A-Cath clean, intact, nontender; mild fullness noted left supraclavicular region ? seroma. heart with regular rate and rhythm. Abdomen soft, positive bowel sounds, nontender. Extremities with full range of motion and no significant edema.  Imaging: No results found.  Labs:  CBC:  Recent Labs  05/14/15 1237 05/29/15 1104 07/29/15 1118 08/09/15 1226  WBC 3.9 2.0* 5.6 4.2  HGB 12.6 12.1 13.3 13.5  HCT 37.3 34.9 39.5 39.6  PLT 221 254 224 265    COAGS:  Recent Labs  02/25/15 1153 08/09/15 1226  INR 0.93 0.92  APTT 31 27    BMP:  Recent Labs  01/30/15 2131 02/25/15 1153 02/28/15 0740  05/14/15 1237 05/29/15 1104 07/29/15 1118 08/09/15 1226  NA 139 140 139  < > 143 141 140 138  K 3.3* 3.6 3.4*  < > 4.0 3.5 4.0 3.6  CL 106 109 106  --   --   --   --  108  CO2 22 21* 22  < > 24 23 21* 24  GLUCOSE 110* 89 98  < > 98 141* 86 90  BUN 6 8 13   < > 7.9 7.2 6.9* 11  CALCIUM 8.8* 8.8* 8.9  < > 8.8 8.9 8.8 9.0  CREATININE 0.63 0.51 0.66  < > 0.7 0.7 0.6 0.60  GFRNONAA >60 >60 >60  --   --   --   --  >  60  GFRAA >60 >60 >60  --   --   --   --  >60  < > = values in this interval not displayed.  LIVER FUNCTION TESTS:  Recent Labs  04/26/15 1035 05/14/15 1237 05/29/15 1104 07/29/15 1118  BILITOT 0.37 0.81 0.54 0.63  AST 16 20 18 17   ALT 15 21 16 13   ALKPHOS 48 58 51 58  PROT 6.3* 6.6 6.5 6.6  ALBUMIN 3.8 4.0 3.9 3.9    Assessment and Plan: Pt with history of Hodgkin's lymphoma , s/p completion of  3 cycles of treatment with near-complete response on imaging. Request now received for Port-A-Cath removal. Details/risks of procedure, including but not limited to, internal bleeding, infection, injury to adjacent structures, discussed with patient and family with their  understanding and consent.   Signed: D. Rowe Robert 08/09/2015, 1:37 PM   I spent a total of 15 minutes at the the patient's bedside AND on the patient's hospital floor or unit, greater than 50% of which was counseling/coordinating care for Port-A-Cath removal

## 2015-09-04 ENCOUNTER — Telehealth: Payer: Self-pay | Admitting: *Deleted

## 2015-09-04 NOTE — Telephone Encounter (Signed)
Voicemail from patient that she had "right port-a-cath removed recently.  Having pain where stitching is out.  at port-a-cath site.  Please call [phone]."   Called received voicemail.  Left questions about fever, site, etc.  Awaiting return call from patient.  Port removed 08-09-2015 by IR.  Return call from patient, "I do not have a fever.  The glue fell off and it's sealed.  Slightly subtle red undertones from healing.  No drainage.  Slightly swollen but does not look infected or feel warm to touch.  The pain is a sharpe pain when I roll over in bed at night or lift something.  Have not taken anything for pain because it's not that bad."  Advised to not lift  or pull, sleep on the right side, prop self with pillows, monitor temperature and do not rub area with baths or showers.  Will notify Dr. Alvy Bimler and call if any orders/instructions.

## 2015-09-05 NOTE — Telephone Encounter (Signed)
Mild internal bruising could be a possibility and that will take time Recommend tylenol prn; may take a few more weeks If she wants me to take a look at it I can see her at 130 pm today

## 2015-09-05 NOTE — Telephone Encounter (Signed)
LVM for pt informing Dr. Alvy Bimler can see her today at 1:30 pm if she wants.  Please call if she wants to be seen today otherwise just call us if it gets worse.

## 2015-09-05 NOTE — Telephone Encounter (Signed)
FYI Called Mallory Price who feels "everything is okay.  I just wanted to make sure what I'm experiencing is normal.  I did also call radiology yesterday, spoke with the IR and was told the same.  Thank you but I do not think I need to come in."  Advised she call again if any changes.

## 2015-09-18 ENCOUNTER — Other Ambulatory Visit: Payer: Self-pay | Admitting: Hematology and Oncology

## 2015-09-18 ENCOUNTER — Telehealth: Payer: Self-pay | Admitting: Hematology and Oncology

## 2015-09-18 ENCOUNTER — Encounter: Payer: Self-pay | Admitting: Hematology and Oncology

## 2015-09-18 DIAGNOSIS — R300 Dysuria: Secondary | ICD-10-CM

## 2015-09-18 DIAGNOSIS — C8111 Nodular sclerosis classical Hodgkin lymphoma, lymph nodes of head, face, and neck: Secondary | ICD-10-CM

## 2015-09-18 HISTORY — DX: Dysuria: R30.0

## 2015-09-18 NOTE — Telephone Encounter (Signed)
Can she come in tomorrow 1130 or 2 pm with labs prior? I will order the tests [facility] pls after you talk to her

## 2015-09-18 NOTE — Telephone Encounter (Signed)
LM to see if she can come in on Thursday at 11:00 for lab/11:30 NG or 1:30 lab/2:00 NG.  Please return call.

## 2015-09-18 NOTE — Telephone Encounter (Signed)
Patient called requesting an appointment with Dr. Alvy Bimler due to she thinks she may have a UTI and does not have a primary so she calls Dr. Alvy Bimler. Patient asked that message be forwarded to NG. Left message for desk nurse and routed this message to NG/desk nurse.

## 2015-09-19 ENCOUNTER — Ambulatory Visit (HOSPITAL_BASED_OUTPATIENT_CLINIC_OR_DEPARTMENT_OTHER): Payer: BLUE CROSS/BLUE SHIELD

## 2015-09-19 ENCOUNTER — Ambulatory Visit (HOSPITAL_BASED_OUTPATIENT_CLINIC_OR_DEPARTMENT_OTHER): Payer: BLUE CROSS/BLUE SHIELD | Admitting: Hematology and Oncology

## 2015-09-19 ENCOUNTER — Encounter: Payer: Self-pay | Admitting: Hematology and Oncology

## 2015-09-19 VITALS — BP 112/56 | HR 88 | Temp 98.2°F | Resp 18 | Ht 61.0 in | Wt 123.2 lb

## 2015-09-19 DIAGNOSIS — C8111 Nodular sclerosis classical Hodgkin lymphoma, lymph nodes of head, face, and neck: Secondary | ICD-10-CM

## 2015-09-19 DIAGNOSIS — R300 Dysuria: Secondary | ICD-10-CM | POA: Diagnosis not present

## 2015-09-19 DIAGNOSIS — Z8571 Personal history of Hodgkin lymphoma: Secondary | ICD-10-CM

## 2015-09-19 LAB — CBC WITH DIFFERENTIAL/PLATELET
BASO%: 0.2 % (ref 0.0–2.0)
BASOS ABS: 0 10*3/uL (ref 0.0–0.1)
EOS%: 1.2 % (ref 0.0–7.0)
Eosinophils Absolute: 0.1 10*3/uL (ref 0.0–0.5)
HCT: 38.7 % (ref 34.8–46.6)
HGB: 13.2 g/dL (ref 11.6–15.9)
LYMPH%: 35.8 % (ref 14.0–49.7)
MCH: 31.8 pg (ref 25.1–34.0)
MCHC: 34.1 g/dL (ref 31.5–36.0)
MCV: 93.3 fL (ref 79.5–101.0)
MONO#: 0.3 10*3/uL (ref 0.1–0.9)
MONO%: 7.2 % (ref 0.0–14.0)
NEUT#: 2.4 10*3/uL (ref 1.5–6.5)
NEUT%: 55.6 % (ref 38.4–76.8)
Platelets: 256 10*3/uL (ref 145–400)
RBC: 4.15 10*6/uL (ref 3.70–5.45)
RDW: 13.2 % (ref 11.2–14.5)
WBC: 4.3 10*3/uL (ref 3.9–10.3)
lymph#: 1.6 10*3/uL (ref 0.9–3.3)

## 2015-09-19 LAB — COMPREHENSIVE METABOLIC PANEL
ALBUMIN: 3.6 g/dL (ref 3.5–5.0)
ALK PHOS: 52 U/L (ref 40–150)
ALT: 10 U/L (ref 0–55)
AST: 16 U/L (ref 5–34)
Anion Gap: 9 mEq/L (ref 3–11)
BUN: 12.5 mg/dL (ref 7.0–26.0)
CO2: 26 mEq/L (ref 22–29)
Calcium: 8.9 mg/dL (ref 8.4–10.4)
Chloride: 108 mEq/L (ref 98–109)
Creatinine: 0.8 mg/dL (ref 0.6–1.1)
GLUCOSE: 90 mg/dL (ref 70–140)
POTASSIUM: 3.9 meq/L (ref 3.5–5.1)
SODIUM: 144 meq/L (ref 136–145)
Total Bilirubin: 0.35 mg/dL (ref 0.20–1.20)
Total Protein: 6.9 g/dL (ref 6.4–8.3)

## 2015-09-19 LAB — URINALYSIS, MICROSCOPIC - CHCC
BILIRUBIN (URINE): NEGATIVE
BLOOD: NEGATIVE
GLUCOSE UR CHCC: NEGATIVE mg/dL
Ketones: NEGATIVE mg/dL
LEUKOCYTE ESTERASE: NEGATIVE
NITRITE: NEGATIVE
Protein: NEGATIVE mg/dL
SPECIFIC GRAVITY, URINE: 1.015 (ref 1.003–1.035)
Urobilinogen, UR: 0.2 mg/dL (ref 0.2–1)
pH: 7 (ref 4.6–8.0)

## 2015-09-19 MED ORDER — SULFAMETHOXAZOLE-TRIMETHOPRIM 800-160 MG PO TABS: 1.0000 | ORAL_TABLET | Freq: Two times a day (BID) | ORAL | Status: DC

## 2015-09-19 NOTE — Assessment & Plan Note (Signed)
Clinically, she had no signs of disease. Recommend continue history, physical examination and imaging study in the future.

## 2015-09-19 NOTE — Assessment & Plan Note (Signed)
She has signs and symptoms suggestive of possible bladder infection. Urinalysis and CBC are unremarkable but she have symptoms. I will give her 3 days course of Bactrim. If it does not improve, I recommend gynecology evaluation to exclude local vaginal issue. She also has question regarding contraceptives prescription and I recommend she goes to the gynecologist office for further management.

## 2015-09-19 NOTE — Progress Notes (Signed)
[place] OFFICE PROGRESS NOTE  Patient Care Team: No Pcp Per Patient as PCP - General (General Practice) Izora Gala, MD as Consulting Physician (Otolaryngology)  SUMMARY OF ONCOLOGIC HISTORY:   History of hodgkin's lymphoma   02/08/2015 Surgery she underwent excisional lymph node biopsy of the left supraclavicular mass   02/08/2015 Pathology Results Accession: [id number] Biopsy showed Classical Hodgkin Lymphoma   02/22/2015 Imaging ECHO is normal   02/25/2015 Procedure She has port placement   02/26/2015 Miscellaneous PFT is normal   02/28/2015 Bone Marrow Biopsy Accession: [id number] BM biopsy was negative   03/01/2015 - 05/29/2015 Chemotherapy She received ABVD x 3 cycles   04/25/2015 Imaging  PET CT scan showed complete response to treatment.   07/29/2015 Imaging CT scan showed positive response to Rx with regression in size of mediastinal LN    INTERVAL HISTORY: Please see below for problem oriented charting. She is seen urgently today because of dysuria. She denies new lymphadenopathy. She denies nausea, vomiting or chills. Denies flank pain. She has a very mild discharge when she urinate with malodorous urine. She denies hematuria. She uses Nuvaring for contraceptives and has question regarding future prescription as she does not have a primary care doctor and she had lost her employment due to recent diagnosis of cancer.  She is expecting her period this week.  REVIEW OF SYSTEMS:   Constitutional: Denies fevers, chills or abnormal weight loss Eyes: Denies blurriness of vision Ears, nose, mouth, throat, and face: Denies mucositis or sore throat Respiratory: Denies cough, dyspnea or wheezes Cardiovascular: Denies palpitation, chest discomfort or lower extremity swelling Gastrointestinal:  Denies nausea, heartburn or change in bowel habits Skin: Denies abnormal skin rashes Lymphatics: Denies new lymphadenopathy or easy bruising Neurological:Denies numbness, tingling or  new weaknesses Behavioral/Psych: Mood is stable, no new changes  All other systems were reviewed with the patient and are negative.  I have reviewed the past medical history, past surgical history, social history and family history with the patient and they are unchanged from previous note.  ALLERGIES:  has No Known Allergies.  MEDICATIONS:  Current Outpatient Prescriptions  Medication Sig Dispense Refill  . Fiber POWD Take 4 g by mouth 2 (two) times daily. Reported on 09/19/2015    . NON FORMULARY Take 1 tablet by mouth 2 (two) times daily. Reported on 09/19/2015    . NON FORMULARY Take 3 tablets by mouth 2 (two) times daily. Reported on 09/19/2015    . NON FORMULARY Take 1 tablet by mouth 2 (two) times daily. Reported on 09/19/2015    . NON FORMULARY Take 1 tablet by mouth 2 (two) times daily. Reported on 09/19/2015    . oxyCODONE-acetaminophen (PERCOCET/ROXICET) 5-325 MG tablet Take 1 tablet by mouth every 4 (four) hours as needed for severe pain. (Patient not taking: Reported on 09/19/2015) 20 tablet 0  . sulfamethoxazole-trimethoprim (BACTRIM DS,SEPTRA DS) 800-160 MG tablet Take 1 tablet by mouth 2 (two) times daily. 6 tablet 0  . zolpidem (AMBIEN) 10 MG tablet Take 1 tablet (10 mg total) by mouth at bedtime as needed for sleep. 20 tablet 0   No current facility-administered medications for this visit.    PHYSICAL EXAMINATION: ECOG PERFORMANCE STATUS: 0 - Asymptomatic  Filed Vitals:   09/19/15 1415  BP: 112/56  Pulse: 88  Temp: 98.2 F (36.8 C)  Resp: 18   Filed Weights   09/19/15 1415  Weight: 123 lb 3.2 oz (55.883 kg)    GENERAL:alert, no distress and comfortable SKIN:  skin color, texture, turgor are normal, no rashes or significant lesions EYES: normal, Conjunctiva are pink and non-injected, sclera clear OROPHARYNX:no exudate, no erythema and lips, buccal mucosa, and tongue normal  NECK: supple, thyroid normal size, non-tender, without nodularity. Palpable seroma on  the left supraclavicular region from previous biopsy LYMPH:  no palpable lymphadenopathy in the cervical, axillary or inguinal LUNGS: clear to auscultation and percussion with normal breathing effort HEART: regular rate & rhythm and no murmurs and no lower extremity edema ABDOMEN:abdomen soft, non-tender and normal bowel sounds. She has mild discomfort on deep palpation off her bladder region /suprapubic Musculoskeletal:no cyanosis of digits and no clubbing  NEURO: alert & oriented x 3 with fluent speech, no focal motor/sensory deficits  LABORATORY DATA:  I have reviewed the data as listed    Component Value Date/Time   NA 138 08/09/2015 1226   NA 140 07/29/2015 1118   K 3.6 08/09/2015 1226   K 4.0 07/29/2015 1118   CL 108 08/09/2015 1226   CO2 24 08/09/2015 1226   CO2 21* 07/29/2015 1118   GLUCOSE 90 08/09/2015 1226   GLUCOSE 86 07/29/2015 1118   BUN 11 08/09/2015 1226   BUN 6.9* 07/29/2015 1118   CREATININE 0.60 08/09/2015 1226   CREATININE 0.6 07/29/2015 1118   CALCIUM 9.0 08/09/2015 1226   CALCIUM 8.8 07/29/2015 1118   PROT 6.6 07/29/2015 1118   PROT 7.4 02/28/2015 0740   ALBUMIN 3.9 07/29/2015 1118   ALBUMIN 3.5 02/28/2015 0740   AST 17 07/29/2015 1118   AST 22 02/28/2015 0740   ALT 13 07/29/2015 1118   ALT 16 02/28/2015 0740   ALKPHOS 58 07/29/2015 1118   ALKPHOS 69 02/28/2015 0740   BILITOT 0.63 07/29/2015 1118   BILITOT 0.6 02/28/2015 0740   GFRNONAA >60 08/09/2015 1226   GFRAA >60 08/09/2015 1226    No results found for: SPEP, UPEP  Lab Results  Component Value Date   WBC 4.3 09/19/2015   NEUTROABS 2.4 09/19/2015   HGB 13.2 09/19/2015   HCT 38.7 09/19/2015   MCV 93.3 09/19/2015   PLT 256 09/19/2015      Chemistry      Component Value Date/Time   NA 138 08/09/2015 1226   NA 140 07/29/2015 1118   K 3.6 08/09/2015 1226   K 4.0 07/29/2015 1118   CL 108 08/09/2015 1226   CO2 24 08/09/2015 1226   CO2 21* 07/29/2015 1118   BUN 11 08/09/2015 1226    BUN 6.9* 07/29/2015 1118   CREATININE 0.60 08/09/2015 1226   CREATININE 0.6 07/29/2015 1118      Component Value Date/Time   CALCIUM 9.0 08/09/2015 1226   CALCIUM 8.8 07/29/2015 1118   ALKPHOS 58 07/29/2015 1118   ALKPHOS 69 02/28/2015 0740   AST 17 07/29/2015 1118   AST 22 02/28/2015 0740   ALT 13 07/29/2015 1118   ALT 16 02/28/2015 0740   BILITOT 0.63 07/29/2015 1118   BILITOT 0.6 02/28/2015 0740     ASSESSMENT & PLAN:  History of hodgkin's lymphoma  Clinically, she had no signs of disease. Recommend continue history, physical examination and imaging study in the future.  Dysuria  She has signs and symptoms suggestive of possible bladder infection. Urinalysis and CBC are unremarkable but she have symptoms. I will give her 3 days course of Bactrim. If it does not improve, I recommend gynecology evaluation to exclude local vaginal issue. She also has question regarding contraceptives prescription and I recommend she goes to  the gynecologist office for further management.   No orders of the defined types were placed in this encounter.   All questions were answered. The patient knows to call the clinic with any problems, questions or concerns. No barriers to learning was detected. I spent 15 minutes counseling the patient face to face. The total time spent in the appointment was 20 minutes and more than 50% was on counseling and review of test results     [facility], [place], MD 09/19/2015 2:44 PM

## 2015-09-20 LAB — URINE CULTURE

## 2015-09-20 LAB — PREGNANCY, URINE: Preg Test, Ur: NEGATIVE

## 2015-10-31 ENCOUNTER — Other Ambulatory Visit: Payer: BLUE CROSS/BLUE SHIELD

## 2015-10-31 ENCOUNTER — Ambulatory Visit: Payer: BLUE CROSS/BLUE SHIELD | Admitting: Hematology and Oncology

## 2015-10-31 ENCOUNTER — Other Ambulatory Visit: Payer: Self-pay | Admitting: Hematology and Oncology

## 2015-10-31 ENCOUNTER — Other Ambulatory Visit: Payer: Self-pay | Admitting: *Deleted

## 2015-10-31 ENCOUNTER — Telehealth: Payer: Self-pay | Admitting: *Deleted

## 2015-10-31 DIAGNOSIS — Z8571 Personal history of Hodgkin lymphoma: Secondary | ICD-10-CM

## 2015-10-31 NOTE — Telephone Encounter (Signed)
Spoke with patient. She has changed insurance, we will reschedule her to the end of March (3 months from last office visit per Dr Alvy Bimler). Pt states she is doing well

## 2015-11-02 ENCOUNTER — Telehealth: Payer: Self-pay | Admitting: Hematology and Oncology

## 2015-11-02 NOTE — Telephone Encounter (Signed)
Left message re march appointments. Schedule mailed

## 2015-12-20 ENCOUNTER — Other Ambulatory Visit: Payer: BLUE CROSS/BLUE SHIELD

## 2015-12-20 ENCOUNTER — Telehealth: Payer: Self-pay | Admitting: *Deleted

## 2015-12-20 ENCOUNTER — Ambulatory Visit: Payer: BLUE CROSS/BLUE SHIELD | Admitting: Hematology and Oncology

## 2015-12-20 NOTE — Telephone Encounter (Signed)
Pt did not show for her appt today w/ Dr. Alvy Bimler.  Called pt and LVM to make sure she is ok and to R/S.   Asked pt to please call nurse back so we can r/s her.

## 2016-01-30 ENCOUNTER — Telehealth: Payer: Self-pay | Admitting: Hematology and Oncology

## 2016-01-30 NOTE — Telephone Encounter (Signed)
s.w. pt and advised on 5.15 appt....pt ok and aware

## 2016-02-03 ENCOUNTER — Telehealth: Payer: Self-pay | Admitting: Hematology and Oncology

## 2016-02-03 ENCOUNTER — Ambulatory Visit (HOSPITAL_BASED_OUTPATIENT_CLINIC_OR_DEPARTMENT_OTHER): Payer: BLUE CROSS/BLUE SHIELD | Admitting: Hematology and Oncology

## 2016-02-03 ENCOUNTER — Other Ambulatory Visit (HOSPITAL_BASED_OUTPATIENT_CLINIC_OR_DEPARTMENT_OTHER): Payer: BLUE CROSS/BLUE SHIELD

## 2016-02-03 ENCOUNTER — Encounter: Payer: Self-pay | Admitting: Hematology and Oncology

## 2016-02-03 VITALS — BP 110/68 | HR 76 | Temp 97.8°F | Resp 18 | Ht 61.0 in | Wt 125.6 lb

## 2016-02-03 DIAGNOSIS — R6884 Jaw pain: Secondary | ICD-10-CM | POA: Diagnosis not present

## 2016-02-03 DIAGNOSIS — T792XXS Traumatic secondary and recurrent hemorrhage and seroma, sequela: Secondary | ICD-10-CM

## 2016-02-03 DIAGNOSIS — G8929 Other chronic pain: Secondary | ICD-10-CM | POA: Insufficient documentation

## 2016-02-03 DIAGNOSIS — T888XXS Other specified complications of surgical and medical care, not elsewhere classified, sequela: Secondary | ICD-10-CM

## 2016-02-03 DIAGNOSIS — Z8571 Personal history of Hodgkin lymphoma: Secondary | ICD-10-CM

## 2016-02-03 LAB — LACTATE DEHYDROGENASE: LDH: 140 U/L (ref 125–245)

## 2016-02-03 LAB — CBC WITH DIFFERENTIAL/PLATELET
BASO%: 0.2 % (ref 0.0–2.0)
BASOS ABS: 0 10*3/uL (ref 0.0–0.1)
EOS ABS: 0.1 10*3/uL (ref 0.0–0.5)
EOS%: 2 % (ref 0.0–7.0)
HCT: 39.5 % (ref 34.8–46.6)
HGB: 13.4 g/dL (ref 11.6–15.9)
LYMPH%: 34.6 % (ref 14.0–49.7)
MCH: 32.3 pg (ref 25.1–34.0)
MCHC: 33.9 g/dL (ref 31.5–36.0)
MCV: 95.2 fL (ref 79.5–101.0)
MONO#: 0.3 10*3/uL (ref 0.1–0.9)
MONO%: 6.9 % (ref 0.0–14.0)
NEUT%: 56.3 % (ref 38.4–76.8)
NEUTROS ABS: 2.3 10*3/uL (ref 1.5–6.5)
Platelets: 226 10*3/uL (ref 145–400)
RBC: 4.15 10*6/uL (ref 3.70–5.45)
RDW: 12.9 % (ref 11.2–14.5)
WBC: 4.1 10*3/uL (ref 3.9–10.3)
lymph#: 1.4 10*3/uL (ref 0.9–3.3)

## 2016-02-03 LAB — COMPREHENSIVE METABOLIC PANEL
ALT: 10 U/L (ref 0–55)
AST: 16 U/L (ref 5–34)
Albumin: 3.5 g/dL (ref 3.5–5.0)
Alkaline Phosphatase: 50 U/L (ref 40–150)
Anion Gap: 6 mEq/L (ref 3–11)
BUN: 12.5 mg/dL (ref 7.0–26.0)
CHLORIDE: 107 meq/L (ref 98–109)
CO2: 25 meq/L (ref 22–29)
Calcium: 8.6 mg/dL (ref 8.4–10.4)
Creatinine: 0.8 mg/dL (ref 0.6–1.1)
GLUCOSE: 81 mg/dL (ref 70–140)
POTASSIUM: 4.1 meq/L (ref 3.5–5.1)
SODIUM: 138 meq/L (ref 136–145)
Total Bilirubin: 0.92 mg/dL (ref 0.20–1.20)
Total Protein: 6.6 g/dL (ref 6.4–8.3)

## 2016-02-03 NOTE — Assessment & Plan Note (Signed)
She has recent acute right jaw pain. I do not think it is related to lymphoma. Examination shows localized tenderness in the right TMJ area. I recommend dental evaluation by her dentist for further management.

## 2016-02-03 NOTE — Telephone Encounter (Signed)
spoke w/ pt confirmed sept apt

## 2016-02-03 NOTE — Progress Notes (Signed)
[place] OFFICE PROGRESS NOTE  Patient Care Team: No Pcp Per Patient as PCP - General (General Practice) Izora Gala, MD as Consulting Physician (Otolaryngology)  SUMMARY OF ONCOLOGIC HISTORY:   History of hodgkin's lymphoma   02/08/2015 Surgery she underwent excisional lymph node biopsy of the left supraclavicular mass   02/08/2015 Pathology Results Accession: [id number] Biopsy showed Classical Hodgkin Lymphoma   02/22/2015 Imaging ECHO is normal   02/25/2015 Procedure She has port placement   02/26/2015 Miscellaneous PFT is normal   02/28/2015 Bone Marrow Biopsy Accession: [id number] BM biopsy was negative   03/01/2015 - 05/29/2015 Chemotherapy She received ABVD x 3 cycles   04/25/2015 Imaging  PET CT scan showed complete response to treatment.   07/29/2015 Imaging CT scan showed positive response to Rx with regression in size of mediastinal LN    INTERVAL HISTORY: Please see below for problem oriented charting. She returns for further follow-up. Denies new lymphadenopathy. She had recent sinus infection 2 weeks ago, resolved with conservative management with over-the-counter medicine She complained of recent onset of right jaw pain and mild hearing changes on the right side.  REVIEW OF SYSTEMS:   Constitutional: Denies fevers, chills or abnormal weight loss Eyes: Denies blurriness of vision Ears, nose, mouth, throat, and face: Denies mucositis or sore throat Respiratory: Denies cough, dyspnea or wheezes Cardiovascular: Denies palpitation, chest discomfort or lower extremity swelling Gastrointestinal:  Denies nausea, heartburn or change in bowel habits Skin: Denies abnormal skin rashes Lymphatics: Denies new lymphadenopathy or easy bruising Neurological:Denies numbness, tingling or new weaknesses Behavioral/Psych: Mood is stable, no new changes  All other systems were reviewed with the patient and are negative.  I have reviewed the past medical history, past surgical  history, social history and family history with the patient and they are unchanged from previous note.  ALLERGIES:  has No Known Allergies.  MEDICATIONS:  Current Outpatient Prescriptions  Medication Sig Dispense Refill  . etonogestrel-ethinyl estradiol (NUVARING) 0.12-0.015 MG/24HR vaginal ring Place 1 each vaginally every 28 (twenty-eight) days. Insert vaginally and leave in place for 3 consecutive weeks, then remove for 1 week.    . Fiber POWD Take 4 g by mouth 2 (two) times daily. Reported on 09/19/2015    . NON FORMULARY Take 1 tablet by mouth 2 (two) times daily. Reported on 09/19/2015    . NON FORMULARY Take 3 tablets by mouth 2 (two) times daily. Reported on 09/19/2015    . NON FORMULARY Take 1 tablet by mouth 2 (two) times daily. Reported on 09/19/2015    . NON FORMULARY Take 1 tablet by mouth 2 (two) times daily. Reported on 09/19/2015     No current facility-administered medications for this visit.    PHYSICAL EXAMINATION: ECOG PERFORMANCE STATUS: 0 - Asymptomatic  Filed Vitals:   02/03/16 1325  BP: 110/68  Pulse: 76  Temp: 97.8 F (36.6 C)  Resp: 18   Filed Weights   02/03/16 1325  Weight: 125 lb 9.6 oz (56.972 kg)    GENERAL:alert, no distress and comfortable SKIN: skin color, texture, turgor are normal, no rashes or significant lesions EYES: normal, Conjunctiva are pink and non-injected, sclera clear OROPHARYNX:no exudate, no erythema and lips, buccal mucosa, and tongue normal NECK: supple, thyroid normal size, non-tender, without nodularity. She has mild tenderness in the right TMJ area LYMPH:  no palpable lymphadenopathy in the cervical, axillary or inguinal. She has persistent postoperative seroma over the left supraclavicular region LUNGS: clear to auscultation and percussion with normal  breathing effort HEART: regular rate & rhythm and no murmurs and no lower extremity edema ABDOMEN:abdomen soft, non-tender and normal bowel sounds Musculoskeletal:no  cyanosis of digits and no clubbing  NEURO: alert & oriented x 3 with fluent speech, no focal motor/sensory deficits  LABORATORY DATA:  I have reviewed the data as listed    Component Value Date/Time   NA 144 09/19/2015 1404   NA 138 08/09/2015 1226   K 3.9 09/19/2015 1404   K 3.6 08/09/2015 1226   CL 108 08/09/2015 1226   CO2 26 09/19/2015 1404   CO2 24 08/09/2015 1226   GLUCOSE 90 09/19/2015 1404   GLUCOSE 90 08/09/2015 1226   BUN 12.5 09/19/2015 1404   BUN 11 08/09/2015 1226   CREATININE 0.8 09/19/2015 1404   CREATININE 0.60 08/09/2015 1226   CALCIUM 8.9 09/19/2015 1404   CALCIUM 9.0 08/09/2015 1226   PROT 6.9 09/19/2015 1404   PROT 7.4 02/28/2015 0740   ALBUMIN 3.6 09/19/2015 1404   ALBUMIN 3.5 02/28/2015 0740   AST 16 09/19/2015 1404   AST 22 02/28/2015 0740   ALT 10 09/19/2015 1404   ALT 16 02/28/2015 0740   ALKPHOS 52 09/19/2015 1404   ALKPHOS 69 02/28/2015 0740   BILITOT 0.35 09/19/2015 1404   BILITOT 0.6 02/28/2015 0740   GFRNONAA >60 08/09/2015 1226   GFRAA >60 08/09/2015 1226    No results found for: SPEP, UPEP  Lab Results  Component Value Date   WBC 4.1 02/03/2016   NEUTROABS 2.3 02/03/2016   HGB 13.4 02/03/2016   HCT 39.5 02/03/2016   MCV 95.2 02/03/2016   PLT 226 02/03/2016      Chemistry      Component Value Date/Time   NA 144 09/19/2015 1404   NA 138 08/09/2015 1226   K 3.9 09/19/2015 1404   K 3.6 08/09/2015 1226   CL 108 08/09/2015 1226   CO2 26 09/19/2015 1404   CO2 24 08/09/2015 1226   BUN 12.5 09/19/2015 1404   BUN 11 08/09/2015 1226   CREATININE 0.8 09/19/2015 1404   CREATININE 0.60 08/09/2015 1226      Component Value Date/Time   CALCIUM 8.9 09/19/2015 1404   CALCIUM 9.0 08/09/2015 1226   ALKPHOS 52 09/19/2015 1404   ALKPHOS 69 02/28/2015 0740   AST 16 09/19/2015 1404   AST 22 02/28/2015 0740   ALT 10 09/19/2015 1404   ALT 16 02/28/2015 0740   BILITOT 0.35 09/19/2015 1404   BILITOT 0.6 02/28/2015 0740     ASSESSMENT &  PLAN:  History of hodgkin's lymphoma  Clinically, she had no signs of disease. She is due for repeat CT scan. I will order it next week. If it comes back negative, I will see her back in 4 months with history & physical examination. In the first 2 years since completion of treatment, I would recommend CT scan every 6 months. After 2 years, I will discontinue imaging study We discussed increase physical activity. I gave her additional resources from the [place] [facility]. We discussed importance of vitamin D supplementation.  Jaw pain She has recent acute right jaw pain. I do not think it is related to lymphoma. Examination shows localized tenderness in the right TMJ area. I recommend dental evaluation by her dentist for further management.  Postoperative seroma She has persistent postoperative seroma, smaller compared to prior exam It does not bother her. At present time, I recommend observation only.    Orders Placed This Encounter  Procedures  .  CT Chest W Contrast    Standing Status: Future     Number of Occurrences:      Standing Expiration Date: 04/04/2017    Order Specific Question:  Reason for Exam (SYMPTOM  OR DIAGNOSIS REQUIRED)    Answer:  hodgkin lymphoma, exclude recurrence    Order Specific Question:  Is the patient pregnant?    Answer:  No    Order Specific Question:  Preferred imaging location?    Answer:  [facility]  . CT Soft Tissue Neck W Contrast    Standing Status: Future     Number of Occurrences:      Standing Expiration Date: 05/05/2017    Order Specific Question:  Reason for Exam (SYMPTOM  OR DIAGNOSIS REQUIRED)    Answer:  hodgkin lymphoma, exclude recurrence    Order Specific Question:  Is the patient pregnant?    Answer:  No    Order Specific Question:  Preferred imaging location?    Answer:  [facility]  . CBC with Differential/Platelet    Standing Status: Future     Number of Occurrences:      Standing Expiration  Date: 03/09/2017  . Comprehensive metabolic panel    Standing Status: Future     Number of Occurrences:      Standing Expiration Date: 03/09/2017  . Lactate dehydrogenase (LDH)    Standing Status: Future     Number of Occurrences:      Standing Expiration Date: 03/09/2017   All questions were answered. The patient knows to call the clinic with any problems, questions or concerns. No barriers to learning was detected. I spent 15 minutes counseling the patient face to face. The total time spent in the appointment was 20 minutes and more than 50% was on counseling and review of test results     [facility], [place], MD 02/03/2016 1:48 PM

## 2016-02-03 NOTE — Assessment & Plan Note (Signed)
Clinically, she had no signs of disease. She is due for repeat CT scan. I will order it next week. If it comes back negative, I will see her back in 4 months with history & physical examination. In the first 2 years since completion of treatment, I would recommend CT scan every 6 months. After 2 years, I will discontinue imaging study We discussed increase physical activity. I gave her additional resources from the [place]. We discussed importance of vitamin D supplementation.

## 2016-02-03 NOTE — Assessment & Plan Note (Signed)
She has persistent postoperative seroma, smaller compared to prior exam It does not bother her. At present time, I recommend observation only.

## 2016-02-10 ENCOUNTER — Ambulatory Visit (HOSPITAL_COMMUNITY): Payer: BLUE CROSS/BLUE SHIELD

## 2016-02-12 ENCOUNTER — Ambulatory Visit (HOSPITAL_COMMUNITY)
Admission: RE | Admit: 2016-02-12 | Discharge: 2016-02-12 | Disposition: A | Discharge status: Home / Self Care | Payer: BLUE CROSS/BLUE SHIELD | Source: Ambulatory Visit | Attending: Hematology and Oncology | Admitting: Hematology and Oncology

## 2016-02-12 ENCOUNTER — Encounter (HOSPITAL_COMMUNITY): Payer: Self-pay

## 2016-02-12 DIAGNOSIS — M2548 Effusion, other site: Secondary | ICD-10-CM | POA: Diagnosis not present

## 2016-02-12 DIAGNOSIS — Z8571 Personal history of Hodgkin lymphoma: Secondary | ICD-10-CM | POA: Diagnosis not present

## 2016-02-12 DIAGNOSIS — M899 Disorder of bone, unspecified: Secondary | ICD-10-CM | POA: Diagnosis not present

## 2016-02-12 MED ORDER — IOPAMIDOL (ISOVUE-300) INJECTION 61%
100.0000 mL | Freq: Once | INTRAVENOUS | Status: AC | PRN
  Administered 2016-02-12: 100 mL via INTRAVENOUS

## 2016-02-13 ENCOUNTER — Telehealth: Payer: Self-pay | Admitting: *Deleted

## 2016-02-13 NOTE — Telephone Encounter (Signed)
Informed pt of Dr. Calton Dach note/ CT results good.  Pt verbalized understanding and confirms her next appt in September.

## 2016-02-13 NOTE — Telephone Encounter (Signed)
-----   Message from Heath Lark, MD sent at 02/13/2016  8:11 AM EDT ----- Regarding: CT scan pls let her know CT scan is clear. All other lymph nodes are smaller, back to nromal ----- Message -----    From: Rad Results In Interface    Sent: 02/12/2016   2:40 PM      To: Heath Lark, MD

## 2016-05-08 ENCOUNTER — Telehealth: Payer: Self-pay | Admitting: Hematology and Oncology

## 2016-05-08 NOTE — Telephone Encounter (Signed)
left msg confirming 9/21 apt times, changed per provider request

## 2016-06-05 ENCOUNTER — Ambulatory Visit: Payer: BLUE CROSS/BLUE SHIELD | Admitting: Hematology and Oncology

## 2016-06-05 ENCOUNTER — Other Ambulatory Visit: Payer: BLUE CROSS/BLUE SHIELD

## 2016-06-11 ENCOUNTER — Encounter: Payer: Self-pay | Admitting: Hematology and Oncology

## 2016-06-11 ENCOUNTER — Telehealth: Payer: Self-pay | Admitting: Hematology and Oncology

## 2016-06-11 ENCOUNTER — Other Ambulatory Visit (HOSPITAL_BASED_OUTPATIENT_CLINIC_OR_DEPARTMENT_OTHER): Payer: BLUE CROSS/BLUE SHIELD

## 2016-06-11 ENCOUNTER — Ambulatory Visit (HOSPITAL_BASED_OUTPATIENT_CLINIC_OR_DEPARTMENT_OTHER): Payer: BLUE CROSS/BLUE SHIELD | Admitting: Hematology and Oncology

## 2016-06-11 VITALS — BP 109/66 | HR 75 | Temp 97.4°F | Resp 18 | Ht 61.0 in | Wt 130.1 lb

## 2016-06-11 DIAGNOSIS — C8111 Nodular sclerosis classical Hodgkin lymphoma, lymph nodes of head, face, and neck: Secondary | ICD-10-CM

## 2016-06-11 DIAGNOSIS — Z23 Encounter for immunization: Secondary | ICD-10-CM | POA: Diagnosis not present

## 2016-06-11 DIAGNOSIS — Z8571 Personal history of Hodgkin lymphoma: Secondary | ICD-10-CM

## 2016-06-11 DIAGNOSIS — C8191 Hodgkin lymphoma, unspecified, lymph nodes of head, face, and neck: Secondary | ICD-10-CM | POA: Diagnosis not present

## 2016-06-11 LAB — COMPREHENSIVE METABOLIC PANEL
ALBUMIN: 3.5 g/dL (ref 3.5–5.0)
ALK PHOS: 50 U/L (ref 40–150)
ALT: 9 U/L (ref 0–55)
AST: 16 U/L (ref 5–34)
Anion Gap: 10 mEq/L (ref 3–11)
BUN: 11.4 mg/dL (ref 7.0–26.0)
CALCIUM: 9 mg/dL (ref 8.4–10.4)
CO2: 21 mEq/L — ABNORMAL LOW (ref 22–29)
CREATININE: 0.7 mg/dL (ref 0.6–1.1)
Chloride: 109 mEq/L (ref 98–109)
EGFR: >90 mL/min/{1.73_m2} (ref >90)
GLUCOSE: 92 mg/dL (ref 70–140)
Potassium: 3.9 mEq/L (ref 3.5–5.1)
SODIUM: 140 meq/L (ref 136–145)
Total Bilirubin: 1.25 mg/dL — ABNORMAL HIGH (ref 0.20–1.20)
Total Protein: 7 g/dL (ref 6.4–8.3)

## 2016-06-11 LAB — LACTATE DEHYDROGENASE: LDH: 128 U/L (ref 125–245)

## 2016-06-11 LAB — CBC WITH DIFFERENTIAL/PLATELET
BASO%: 0.7 % (ref 0.0–2.0)
Basophils Absolute: 0 10*3/uL (ref 0.0–0.1)
EOS%: 4.7 % (ref 0.0–7.0)
Eosinophils Absolute: 0.2 10*3/uL (ref 0.0–0.5)
HEMATOCRIT: 42 % (ref 34.8–46.6)
HEMOGLOBIN: 14.2 g/dL (ref 11.6–15.9)
LYMPH#: 1.9 10*3/uL (ref 0.9–3.3)
LYMPH%: 41.8 % (ref 14.0–49.7)
MCH: 31.9 pg (ref 25.1–34.0)
MCHC: 33.9 g/dL (ref 31.5–36.0)
MCV: 94.1 fL (ref 79.5–101.0)
MONO#: 0.3 10*3/uL (ref 0.1–0.9)
MONO%: 6.3 % (ref 0.0–14.0)
NEUT%: 46.5 % (ref 38.4–76.8)
NEUTROS ABS: 2.1 10*3/uL (ref 1.5–6.5)
Platelets: 240 10*3/uL (ref 145–400)
RBC: 4.46 10*6/uL (ref 3.70–5.45)
RDW: 13.1 % (ref 11.2–14.5)
WBC: 4.5 10*3/uL (ref 3.9–10.3)

## 2016-06-11 MED ORDER — INFLUENZA VAC SPLIT QUAD 0.5 ML IM SUSY
0.5000 mL | PREFILLED_SYRINGE | Freq: Once | INTRAMUSCULAR | Status: AC
  Administered 2016-06-11: 0.5 mL via INTRAMUSCULAR
  Filled 2016-06-11: qty 0.5

## 2016-06-11 NOTE — Progress Notes (Signed)
[place] OFFICE PROGRESS NOTE  Patient Care Team: No Pcp Per Patient as PCP - General (General Practice) Izora Gala, MD as Consulting Physician (Otolaryngology)  SUMMARY OF ONCOLOGIC HISTORY:   History of hodgkin's lymphoma   02/08/2015 Surgery    she underwent excisional lymph node biopsy of the left supraclavicular mass      02/08/2015 Pathology Results    Accession: [id number] Biopsy showed Classical Hodgkin Lymphoma      02/22/2015 Imaging    ECHO is normal      02/25/2015 Procedure    She has port placement      02/26/2015 Miscellaneous    PFT is normal      02/28/2015 Bone Marrow Biopsy    Accession: [id number] BM biopsy was negative      03/01/2015 - 05/29/2015 Chemotherapy    She received ABVD x 3 cycles      04/25/2015 Imaging     PET CT scan showed complete response to treatment.      07/29/2015 Imaging    CT scan showed positive response to Rx with regression in size of mediastinal LN      02/12/2016 Imaging    CT scan is negative for recurrence       INTERVAL HISTORY: Please see below for problem oriented charting. She returns for further follow-up. She denies recent infection. No new lymphadenopathy. The seroma at the base of her left neck had resolved.  REVIEW OF SYSTEMS:   Constitutional: Denies fevers, chills or abnormal weight loss Eyes: Denies blurriness of vision Ears, nose, mouth, throat, and face: Denies mucositis or sore throat Respiratory: Denies cough, dyspnea or wheezes Cardiovascular: Denies palpitation, chest discomfort or lower extremity swelling Gastrointestinal:  Denies nausea, heartburn or change in bowel habits Skin: Denies abnormal skin rashes Lymphatics: Denies new lymphadenopathy or easy bruising Neurological:Denies numbness, tingling or new weaknesses Behavioral/Psych: Mood is stable, no new changes  All other systems were reviewed with the patient and are negative.  I have reviewed the past medical history,  past surgical history, social history and family history with the patient and they are unchanged from previous note.  ALLERGIES:  has No Known Allergies.  MEDICATIONS:  Current Outpatient Prescriptions  Medication Sig Dispense Refill  . Biotin w/ Vitamins C & E (HAIR/SKIN/NAILS PO) Take by mouth daily.    . cholecalciferol (VITAMIN D) 1000 units tablet Take 1,000 Units by mouth daily.    Marland Kitchen etonogestrel-ethinyl estradiol (NUVARING) 0.12-0.015 MG/24HR vaginal ring Place 1 each vaginally every 28 (twenty-eight) days. Insert vaginally and leave in place for 3 consecutive weeks, then remove for 1 week.    . vitamin B-12 (CYANOCOBALAMIN) 1000 MCG tablet Take 1,000 mcg by mouth daily.     No current facility-administered medications for this visit.     PHYSICAL EXAMINATION: ECOG PERFORMANCE STATUS: 0 - Asymptomatic  Vitals:   06/11/16 1252  BP: 109/66  Pulse: 75  Resp: 18  Temp: 97.4 F (36.3 C)   Filed Weights   06/11/16 1252  Weight: 130 lb 1.6 oz (59 kg)    GENERAL:alert, no distress and comfortable SKIN: skin color, texture, turgor are normal, no rashes or significant lesions EYES: normal, Conjunctiva are pink and non-injected, sclera clear OROPHARYNX:no exudate, no erythema and lips, buccal mucosa, and tongue normal  NECK: supple, thyroid normal size, non-tender, without nodularity LYMPH:  no palpable lymphadenopathy in the cervical, axillary or inguinal LUNGS: clear to auscultation and percussion with normal breathing effort HEART: regular rate & rhythm  and no murmurs and no lower extremity edema ABDOMEN:abdomen soft, non-tender and normal bowel sounds Musculoskeletal:no cyanosis of digits and no clubbing  NEURO: alert & oriented x 3 with fluent speech, no focal motor/sensory deficits  LABORATORY DATA:  I have reviewed the data as listed    Component Value Date/Time   NA 140 06/11/2016 1229   K 3.9 06/11/2016 1229   CL 108 08/09/2015 1226   CO2 21 (L) 06/11/2016 1229    GLUCOSE 92 06/11/2016 1229   BUN 11.4 06/11/2016 1229   CREATININE 0.7 06/11/2016 1229   CALCIUM 9.0 06/11/2016 1229   PROT 7.0 06/11/2016 1229   ALBUMIN 3.5 06/11/2016 1229   AST 16 06/11/2016 1229   ALT 9 06/11/2016 1229   ALKPHOS 50 06/11/2016 1229   BILITOT 1.25 (H) 06/11/2016 1229   GFRNONAA >60 08/09/2015 1226   GFRAA >60 08/09/2015 1226    No results found for: SPEP, UPEP  Lab Results  Component Value Date   WBC 4.5 06/11/2016   NEUTROABS 2.1 06/11/2016   HGB 14.2 06/11/2016   HCT 42.0 06/11/2016   MCV 94.1 06/11/2016   PLT 240 06/11/2016      Chemistry      Component Value Date/Time   NA 140 06/11/2016 1229   K 3.9 06/11/2016 1229   CL 108 08/09/2015 1226   CO2 21 (L) 06/11/2016 1229   BUN 11.4 06/11/2016 1229   CREATININE 0.7 06/11/2016 1229      Component Value Date/Time   CALCIUM 9.0 06/11/2016 1229   ALKPHOS 50 06/11/2016 1229   AST 16 06/11/2016 1229   ALT 9 06/11/2016 1229   BILITOT 1.25 (H) 06/11/2016 1229     ASSESSMENT & PLAN:  History of hodgkin's lymphoma Clinically, she had no signs of disease. She is due for repeat CT scan in her next visit I will see her back in 4 months with history & physical examination. In the first 2 years since completion of treatment, I would recommend CT scan every 6 months. After 2 years, I will discontinue imaging study We discussed increase physical activity. We discussed importance of vitamin D supplementation. We discussed the importance of preventive care and reviewed the vaccination programs. She does not have any prior allergic reactions to influenza vaccination. She agrees to proceed with influenza vaccination today and we will administer it today at the clinic.    Orders Placed This Encounter  Procedures  . CT CHEST W CONTRAST    Standing Status:   Future    Standing Expiration Date:   07/16/2017    Order Specific Question:   Reason for exam:    Answer:   History of lymphoma, exclude recurrence     Order Specific Question:   Is the patient pregnant?    Answer:   No    Order Specific Question:   Preferred imaging location?    Answer:   [facility]  . CT ABDOMEN PELVIS W CONTRAST    Standing Status:   Future    Standing Expiration Date:   07/16/2017    Order Specific Question:   Reason for exam:    Answer:   History of lymphoma, exclude recurrence    Order Specific Question:   Is the patient pregnant?    Answer:   No    Order Specific Question:   Preferred imaging location?    Answer:   [facility]  . CBC with Differential/Platelet    Standing Status:   Future  Standing Expiration Date:   07/16/2017  . Comprehensive metabolic panel    Standing Status:   Future    Standing Expiration Date:   07/16/2017  . Lactate dehydrogenase    Standing Status:   Future    Standing Expiration Date:   07/16/2017  . Pregnancy, urine    Standing Status:   Future    Standing Expiration Date:   07/16/2017   All questions were answered. The patient knows to call the clinic with any problems, questions or concerns. No barriers to learning was detected. I spent 10 minutes counseling the patient face to face. The total time spent in the appointment was 15 minutes and more than 50% was on counseling and review of test results     [facility], [place], MD 06/11/2016 1:17 PM

## 2016-06-11 NOTE — Assessment & Plan Note (Signed)
Clinically, she had no signs of disease. She is due for repeat CT scan in her next visit I will see her back in 4 months with history & physical examination. In the first 2 years since completion of treatment, I would recommend CT scan every 6 months. After 2 years, I will discontinue imaging study We discussed increase physical activity. We discussed importance of vitamin D supplementation. We discussed the importance of preventive care and reviewed the vaccination programs. She does not have any prior allergic reactions to influenza vaccination. She agrees to proceed with influenza vaccination today and we will administer it today at the clinic.

## 2016-06-11 NOTE — Telephone Encounter (Signed)
Left message for patient re appointments for January. Central radiology will call re scans. Schedule mailed.

## 2016-10-01 ENCOUNTER — Encounter (HOSPITAL_COMMUNITY): Payer: Self-pay

## 2016-10-01 ENCOUNTER — Other Ambulatory Visit: Payer: BLUE CROSS/BLUE SHIELD

## 2016-10-01 ENCOUNTER — Ambulatory Visit (HOSPITAL_COMMUNITY)
Admission: RE | Admit: 2016-10-01 | Discharge: 2016-10-01 | Disposition: A | Discharge status: Home / Self Care | Payer: BLUE CROSS/BLUE SHIELD | Source: Ambulatory Visit | Attending: Hematology and Oncology | Admitting: Hematology and Oncology

## 2016-10-01 DIAGNOSIS — C8111 Nodular sclerosis classical Hodgkin lymphoma, lymph nodes of head, face, and neck: Secondary | ICD-10-CM | POA: Insufficient documentation

## 2016-10-01 MED ORDER — IOPAMIDOL (ISOVUE-300) INJECTION 61%
INTRAVENOUS | Status: AC
  Filled 2016-10-01: qty 100

## 2016-10-01 MED ORDER — IOPAMIDOL (ISOVUE-300) INJECTION 61%
100.0000 mL | Freq: Once | INTRAVENOUS | Status: AC | PRN
  Administered 2016-10-01: 100 mL via INTRAVENOUS

## 2016-10-02 ENCOUNTER — Other Ambulatory Visit (HOSPITAL_BASED_OUTPATIENT_CLINIC_OR_DEPARTMENT_OTHER): Payer: BLUE CROSS/BLUE SHIELD

## 2016-10-02 ENCOUNTER — Other Ambulatory Visit (HOSPITAL_COMMUNITY)
Admission: RE | Admit: 2016-10-02 | Discharge: 2016-10-02 | Disposition: A | Discharge status: Home / Self Care | Payer: BLUE CROSS/BLUE SHIELD | Source: Ambulatory Visit | Attending: Hematology and Oncology | Admitting: Hematology and Oncology

## 2016-10-02 ENCOUNTER — Ambulatory Visit (HOSPITAL_BASED_OUTPATIENT_CLINIC_OR_DEPARTMENT_OTHER): Payer: BLUE CROSS/BLUE SHIELD | Admitting: Hematology and Oncology

## 2016-10-02 ENCOUNTER — Encounter: Payer: Self-pay | Admitting: Hematology and Oncology

## 2016-10-02 VITALS — BP 116/70 | HR 68 | Temp 98.6°F | Resp 16 | Ht 61.0 in | Wt 137.6 lb

## 2016-10-02 DIAGNOSIS — C8111 Nodular sclerosis classical Hodgkin lymphoma, lymph nodes of head, face, and neck: Secondary | ICD-10-CM | POA: Insufficient documentation

## 2016-10-02 DIAGNOSIS — Z8571 Personal history of Hodgkin lymphoma: Secondary | ICD-10-CM | POA: Diagnosis not present

## 2016-10-02 LAB — CBC WITH DIFFERENTIAL/PLATELET
BASO%: 0.7 % (ref 0.0–2.0)
Basophils Absolute: 0 10*3/uL (ref 0.0–0.1)
EOS%: 2.1 % (ref 0.0–7.0)
Eosinophils Absolute: 0.1 10*3/uL (ref 0.0–0.5)
HCT: 39.4 % (ref 34.8–46.6)
HGB: 13.6 g/dL (ref 11.6–15.9)
LYMPH%: 38.2 % (ref 14.0–49.7)
MCH: 32.4 pg (ref 25.1–34.0)
MCHC: 34.6 g/dL (ref 31.5–36.0)
MCV: 93.6 fL (ref 79.5–101.0)
MONO#: 0.3 10*3/uL (ref 0.1–0.9)
MONO%: 6.5 % (ref 0.0–14.0)
NEUT%: 52.5 % (ref 38.4–76.8)
NEUTROS ABS: 2.1 10*3/uL (ref 1.5–6.5)
PLATELETS: 242 10*3/uL (ref 145–400)
RBC: 4.21 10*6/uL (ref 3.70–5.45)
RDW: 12.7 % (ref 11.2–14.5)
WBC: 4 10*3/uL (ref 3.9–10.3)
lymph#: 1.5 10*3/uL (ref 0.9–3.3)

## 2016-10-02 LAB — COMPREHENSIVE METABOLIC PANEL
ALT: 9 U/L (ref 0–55)
ANION GAP: 9 meq/L (ref 3–11)
AST: 17 U/L (ref 5–34)
Albumin: 3.7 g/dL (ref 3.5–5.0)
Alkaline Phosphatase: 50 U/L (ref 40–150)
BUN: 10.8 mg/dL (ref 7.0–26.0)
CO2: 22 meq/L (ref 22–29)
CREATININE: 0.7 mg/dL (ref 0.6–1.1)
Calcium: 8.9 mg/dL (ref 8.4–10.4)
Chloride: 108 mEq/L (ref 98–109)
EGFR: >90 mL/min/{1.73_m2} (ref >90)
Glucose: 87 mg/dl (ref 70–140)
Potassium: 3.7 mEq/L (ref 3.5–5.1)
SODIUM: 138 meq/L (ref 136–145)
Total Bilirubin: 0.63 mg/dL (ref 0.20–1.20)
Total Protein: 7 g/dL (ref 6.4–8.3)

## 2016-10-02 LAB — PREGNANCY, URINE: PREG TEST UR: NEGATIVE

## 2016-10-02 LAB — LACTATE DEHYDROGENASE: LDH: 152 U/L (ref 125–245)

## 2016-10-02 NOTE — Assessment & Plan Note (Signed)
Clinically, she had no signs of disease. She is due for repeat CT scan in her next visit in 6 months After 2 years, I will discontinue imaging study We discussed increase physical activity. We discussed importance of vitamin D supplementation.

## 2016-10-02 NOTE — Progress Notes (Signed)
[place] OFFICE PROGRESS NOTE  Patient Care Team: No Pcp Per Patient as PCP - General (General Practice) Izora Gala, MD as Consulting Physician (Otolaryngology)  SUMMARY OF ONCOLOGIC HISTORY:   History of Hodgkin's lymphoma   02/08/2015 Surgery    she underwent excisional lymph node biopsy of the left supraclavicular mass      02/08/2015 Pathology Results    Accession: [id number] Biopsy showed Classical Hodgkin Lymphoma      02/22/2015 Imaging    ECHO is normal      02/25/2015 Procedure    She has port placement      02/26/2015 Miscellaneous    PFT is normal      02/28/2015 Bone Marrow Biopsy    Accession: [id number] BM biopsy was negative      03/01/2015 - 05/29/2015 Chemotherapy    She received ABVD x 3 cycles      04/25/2015 Imaging     PET CT scan showed complete response to treatment.      07/29/2015 Imaging    CT scan showed positive response to Rx with regression in size of mediastinal LN      02/12/2016 Imaging    CT scan is negative for recurrence      10/01/2016 Imaging    CT scan showed continued decrease in volume of lymphoid tissue in the anterior mediastinum. 2. No new adenopathy in the thorax. 3. No evidence of lymphoma in the abdomen pelvis. 4. Normal spleen. 5. No skeletal metastasis.       INTERVAL HISTORY: Please see below for problem oriented charting. She returns today with her boyfriend She feels well. Denies recent chest pain, shortness of breath or new lymphadenopathy She denies recent infection  REVIEW OF SYSTEMS:   Constitutional: Denies fevers, chills or abnormal weight loss Eyes: Denies blurriness of vision Ears, nose, mouth, throat, and face: Denies mucositis or sore throat Respiratory: Denies cough, dyspnea or wheezes Cardiovascular: Denies palpitation, chest discomfort or lower extremity swelling Gastrointestinal:  Denies nausea, heartburn or change in bowel habits Skin: Denies abnormal skin rashes Lymphatics: Denies  new lymphadenopathy or easy bruising Neurological:Denies numbness, tingling or new weaknesses Behavioral/Psych: Mood is stable, no new changes  All other systems were reviewed with the patient and are negative.  I have reviewed the past medical history, past surgical history, social history and family history with the patient and they are unchanged from previous note.  ALLERGIES:  has No Known Allergies.  MEDICATIONS:  Current Outpatient Prescriptions  Medication Sig Dispense Refill  . Biotin w/ Vitamins C & E (HAIR/SKIN/NAILS PO) Take by mouth daily.    . cholecalciferol (VITAMIN D) 1000 units tablet Take 1,000 Units by mouth daily.    Marland Kitchen etonogestrel-ethinyl estradiol (NUVARING) 0.12-0.015 MG/24HR vaginal ring Place 1 each vaginally every 28 (twenty-eight) days. Insert vaginally and leave in place for 3 consecutive weeks, then remove for 1 week.    . vitamin B-12 (CYANOCOBALAMIN) 1000 MCG tablet Take 1,000 mcg by mouth daily.     No current facility-administered medications for this visit.     PHYSICAL EXAMINATION: ECOG PERFORMANCE STATUS: 0 - Asymptomatic  Vitals:   10/02/16 1316  BP: 116/70  Pulse: 68  Resp: 16  Temp: 98.6 F (37 C)   Filed Weights   10/02/16 1316  Weight: 137 lb 9.6 oz (62.4 kg)    GENERAL:alert, no distress and comfortable SKIN: skin color, texture, turgor are normal, no rashes or significant lesions EYES: normal, Conjunctiva are pink and non-injected, sclera  clear OROPHARYNX:no exudate, no erythema and lips, buccal mucosa, and tongue normal  NECK: supple, thyroid normal size, non-tender, without nodularity LYMPH:  no palpable lymphadenopathy in the cervical, axillary or inguinal LUNGS: clear to auscultation and percussion with normal breathing effort HEART: regular rate & rhythm and no murmurs and no lower extremity edema ABDOMEN:abdomen soft, non-tender and normal bowel sounds Musculoskeletal:no cyanosis of digits and no clubbing  NEURO: alert &  oriented x 3 with fluent speech, no focal motor/sensory deficits  LABORATORY DATA:  I have reviewed the data as listed    Component Value Date/Time   NA 138 10/02/2016 1250   K 3.7 10/02/2016 1250   CL 108 08/09/2015 1226   CO2 22 10/02/2016 1250   GLUCOSE 87 10/02/2016 1250   BUN 10.8 10/02/2016 1250   CREATININE 0.7 10/02/2016 1250   CALCIUM 8.9 10/02/2016 1250   PROT 7.0 10/02/2016 1250   ALBUMIN 3.7 10/02/2016 1250   AST 17 10/02/2016 1250   ALT 9 10/02/2016 1250   ALKPHOS 50 10/02/2016 1250   BILITOT 0.63 10/02/2016 1250   GFRNONAA >60 08/09/2015 1226   GFRAA >60 08/09/2015 1226    No results found for: SPEP, UPEP  Lab Results  Component Value Date   WBC 4.0 10/02/2016   NEUTROABS 2.1 10/02/2016   HGB 13.6 10/02/2016   HCT 39.4 10/02/2016   MCV 93.6 10/02/2016   PLT 242 10/02/2016      Chemistry      Component Value Date/Time   NA 138 10/02/2016 1250   K 3.7 10/02/2016 1250   CL 108 08/09/2015 1226   CO2 22 10/02/2016 1250   BUN 10.8 10/02/2016 1250   CREATININE 0.7 10/02/2016 1250      Component Value Date/Time   CALCIUM 8.9 10/02/2016 1250   ALKPHOS 50 10/02/2016 1250   AST 17 10/02/2016 1250   ALT 9 10/02/2016 1250   BILITOT 0.63 10/02/2016 1250       RADIOGRAPHIC STUDIES:I reviewed imaging study with the patient I have personally reviewed the radiological images as listed and agreed with the findings in the report. Ct Chest W Contrast  Result Date: 10/01/2016 CLINICAL DATA:  Hodgkin's disease diagnosis May 2016 with lymph node dissection. Chemotherapy complete. EXAM: CT CHEST, ABDOMEN, AND PELVIS WITH CONTRAST TECHNIQUE: Multidetector CT imaging of the chest, abdomen and pelvis was performed following the standard protocol during bolus administration of intravenous contrast. CONTRAST:  137m ISOVUE-300 IOPAMIDOL (ISOVUE-300) INJECTION 61% COMPARISON:  PET-CT 04/25/2015, CT neck chest 02/12/2016 FINDINGS: CT CHEST FINDINGS Cardiovascular: No  significant vascular findings. Normal heart size. No pericardial effusion. Mediastinum/Nodes: No axillary supraclavicular adenopathy. Nodal tissue in the prevascular space is decreased in size measuring 13 mm short axis by 22 mm compared to 22 mm short axis by 42. No new mediastinal adenopathy. No hilar adenopathy. No Lungs/Pleura: No suspicious pulmonary nodularity. Airways normal. Normal pleural surface Musculoskeletal: No aggressive osseous lesion. CT ABDOMEN AND PELVIS FINDINGS Hepatobiliary: No focal hepatic lesion. No biliary ductal dilatation. Gallbladder is normal. Common bile duct is normal. Pancreas: Pancreas is normal. No ductal dilatation. No pancreatic inflammation. Spleen: Normal spleen Adrenals/urinary tract: Adrenal glands , kidneys, and bladder normal Stomach/Bowel: Stomach, small bowel, appendix, and cecum are normal. The colon and rectosigmoid colon are normal. Vascular/Lymphatic: Abdominal aorta is normal caliber. There is no retroperitoneal or periportal lymphadenopathy. No pelvic lymphadenopathy. Reproductive: Uterus and ovaries normal. Other: No free fluid. Musculoskeletal: No aggressive osseous lesion. Segmentation anomaly T10 again IMPRESSION: 1. Continued decrease in volume  of lymphoid tissue in the anterior mediastinum. 2. No new adenopathy in the thorax. 3. No evidence of lymphoma in the abdomen pelvis. 4. Normal spleen. 5. No skeletal metastasis. Electronically Signed   By: Suzy Bouchard M.D.   On: 10/01/2016 15:35   Ct Abdomen Pelvis W Contrast  Result Date: 10/01/2016 CLINICAL DATA:  Hodgkin's disease diagnosis May 2016 with lymph node dissection. Chemotherapy complete. EXAM: CT CHEST, ABDOMEN, AND PELVIS WITH CONTRAST TECHNIQUE: Multidetector CT imaging of the chest, abdomen and pelvis was performed following the standard protocol during bolus administration of intravenous contrast. CONTRAST:  132m ISOVUE-300 IOPAMIDOL (ISOVUE-300) INJECTION 61% COMPARISON:  PET-CT 04/25/2015,  CT neck chest 02/12/2016 FINDINGS: CT CHEST FINDINGS Cardiovascular: No significant vascular findings. Normal heart size. No pericardial effusion. Mediastinum/Nodes: No axillary supraclavicular adenopathy. Nodal tissue in the prevascular space is decreased in size measuring 13 mm short axis by 22 mm compared to 22 mm short axis by 42. No new mediastinal adenopathy. No hilar adenopathy. No Lungs/Pleura: No suspicious pulmonary nodularity. Airways normal. Normal pleural surface Musculoskeletal: No aggressive osseous lesion. CT ABDOMEN AND PELVIS FINDINGS Hepatobiliary: No focal hepatic lesion. No biliary ductal dilatation. Gallbladder is normal. Common bile duct is normal. Pancreas: Pancreas is normal. No ductal dilatation. No pancreatic inflammation. Spleen: Normal spleen Adrenals/urinary tract: Adrenal glands , kidneys, and bladder normal Stomach/Bowel: Stomach, small bowel, appendix, and cecum are normal. The colon and rectosigmoid colon are normal. Vascular/Lymphatic: Abdominal aorta is normal caliber. There is no retroperitoneal or periportal lymphadenopathy. No pelvic lymphadenopathy. Reproductive: Uterus and ovaries normal. Other: No free fluid. Musculoskeletal: No aggressive osseous lesion. Segmentation anomaly T10 again IMPRESSION: 1. Continued decrease in volume of lymphoid tissue in the anterior mediastinum. 2. No new adenopathy in the thorax. 3. No evidence of lymphoma in the abdomen pelvis. 4. Normal spleen. 5. No skeletal metastasis. Electronically Signed   By: SSuzy BouchardM.D.   On: 10/01/2016 15:35    ASSESSMENT & PLAN:  History of hodgkin's lymphoma Clinically, she had no signs of disease. She is due for repeat CT scan in her next visit in 6 months After 2 years, I will discontinue imaging study We discussed increase physical activity. We discussed importance of vitamin D supplementation.   Orders Placed This Encounter  Procedures  . CT CHEST W CONTRAST    Standing Status:   Future     Standing Expiration Date:   11/06/2017    Order Specific Question:   Reason for exam:    Answer:   staging lymphoma, assess response to Rx    Order Specific Question:   Is the patient pregnant?    Answer:   No    Order Specific Question:   [facility]    [facility]    Standing Status:   Future    Standing Expiration Date:   11/06/2017  . CBC with Differential/Platelet    Standing Status:   Future    Standing Expiration Date:   11/06/2017  . Pregnancy, urine    Standing Status:   Future    Standing Expiration Date:   11/06/2017   All questions were answered. The patient knows to call the clinic with any problems, questions or concerns. No barriers to learning was detected. I spent 15 minutes counseling the patient face to face. The total time spent in the appointment was 20 minutes and more than 50% was on counseling and review of test results  Heath Lark, MD 10/02/2016 2:20 PM

## 2016-10-07 DIAGNOSIS — N926 Irregular menstruation, unspecified: Secondary | ICD-10-CM | POA: Insufficient documentation

## 2016-10-16 ENCOUNTER — Telehealth: Payer: Self-pay | Admitting: Hematology and Oncology

## 2016-10-16 NOTE — Telephone Encounter (Signed)
Called patient and confirmed July appointments

## 2017-04-01 ENCOUNTER — Other Ambulatory Visit: Payer: BLUE CROSS/BLUE SHIELD

## 2017-04-02 ENCOUNTER — Ambulatory Visit: Payer: BLUE CROSS/BLUE SHIELD | Admitting: Hematology and Oncology

## 2017-04-09 ENCOUNTER — Other Ambulatory Visit (HOSPITAL_BASED_OUTPATIENT_CLINIC_OR_DEPARTMENT_OTHER): Payer: BLUE CROSS/BLUE SHIELD

## 2017-04-09 ENCOUNTER — Telehealth: Payer: Self-pay | Admitting: Hematology and Oncology

## 2017-04-09 ENCOUNTER — Other Ambulatory Visit (HOSPITAL_COMMUNITY)
Admission: RE | Admit: 2017-04-09 | Discharge: 2017-04-09 | Disposition: A | Discharge status: Home / Self Care | Payer: BLUE CROSS/BLUE SHIELD | Source: Ambulatory Visit | Attending: Hematology and Oncology | Admitting: Hematology and Oncology

## 2017-04-09 ENCOUNTER — Other Ambulatory Visit: Payer: BLUE CROSS/BLUE SHIELD

## 2017-04-09 DIAGNOSIS — C8111 Nodular sclerosis classical Hodgkin lymphoma, lymph nodes of head, face, and neck: Secondary | ICD-10-CM | POA: Diagnosis present

## 2017-04-09 DIAGNOSIS — C8191 Hodgkin lymphoma, unspecified, lymph nodes of head, face, and neck: Secondary | ICD-10-CM | POA: Diagnosis not present

## 2017-04-09 DIAGNOSIS — Z8571 Personal history of Hodgkin lymphoma: Secondary | ICD-10-CM

## 2017-04-09 LAB — CBC WITH DIFFERENTIAL/PLATELET
BASO%: 0.2 % (ref 0.0–2.0)
Basophils Absolute: 0 10*3/uL (ref 0.0–0.1)
EOS%: 1.7 % (ref 0.0–7.0)
Eosinophils Absolute: 0.1 10*3/uL (ref 0.0–0.5)
HCT: 39.1 % (ref 34.8–46.6)
HGB: 13.3 g/dL (ref 11.6–15.9)
LYMPH%: 32.4 % (ref 14.0–49.7)
MCH: 32 pg (ref 25.1–34.0)
MCHC: 34 g/dL (ref 31.5–36.0)
MCV: 94 fL (ref 79.5–101.0)
MONO#: 0.4 10*3/uL (ref 0.1–0.9)
MONO%: 6.5 % (ref 0.0–14.0)
NEUT%: 59.2 % (ref 38.4–76.8)
NEUTROS ABS: 3.7 10*3/uL (ref 1.5–6.5)
Platelets: 252 10*3/uL (ref 145–400)
RBC: 4.16 10*6/uL (ref 3.70–5.45)
RDW: 13.3 % (ref 11.2–14.5)
WBC: 6.3 10*3/uL (ref 3.9–10.3)
lymph#: 2.1 10*3/uL (ref 0.9–3.3)

## 2017-04-09 LAB — COMPREHENSIVE METABOLIC PANEL
ALT: 10 U/L (ref 0–55)
AST: 15 U/L (ref 5–34)
Albumin: 3.7 g/dL (ref 3.5–5.0)
Alkaline Phosphatase: 52 U/L (ref 40–150)
Anion Gap: 9 mEq/L (ref 3–11)
BUN: 11.3 mg/dL (ref 7.0–26.0)
CALCIUM: 9.1 mg/dL (ref 8.4–10.4)
CO2: 22 mEq/L (ref 22–29)
CREATININE: 0.8 mg/dL (ref 0.6–1.1)
Chloride: 107 mEq/L (ref 98–109)
EGFR: >90 mL/min/{1.73_m2} (ref >90)
Glucose: 86 mg/dl (ref 70–140)
POTASSIUM: 3.8 meq/L (ref 3.5–5.1)
Sodium: 137 mEq/L (ref 136–145)
Total Bilirubin: 0.43 mg/dL (ref 0.20–1.20)
Total Protein: 7.1 g/dL (ref 6.4–8.3)

## 2017-04-09 LAB — PREGNANCY, URINE: PREG TEST UR: NEGATIVE

## 2017-04-09 NOTE — Telephone Encounter (Signed)
Patient called to r/s her missed appointment and said that she was never scheduled for a scan... She is now scheduled for labs today 7/20 and her CT is Monday 7/23 at 7am.  You do not have anything availiable until August 6

## 2017-04-12 ENCOUNTER — Ambulatory Visit (HOSPITAL_COMMUNITY): Payer: BLUE CROSS/BLUE SHIELD

## 2017-04-12 NOTE — Telephone Encounter (Signed)
Left message that Dr Alvy Bimler will see her on 8/6 @ 1145. Msg to scheduler

## 2017-04-12 NOTE — Telephone Encounter (Signed)
Her scans is on 7/30. I'm off until 8/3 I can see her either at 1045 or 1145 on 8/6

## 2017-04-19 ENCOUNTER — Ambulatory Visit (HOSPITAL_COMMUNITY)
Admission: RE | Admit: 2017-04-19 | Discharge: 2017-04-19 | Disposition: A | Discharge status: Home / Self Care | Payer: BLUE CROSS/BLUE SHIELD | Source: Ambulatory Visit | Attending: Hematology and Oncology | Admitting: Hematology and Oncology

## 2017-04-19 ENCOUNTER — Encounter (HOSPITAL_COMMUNITY): Payer: Self-pay

## 2017-04-19 DIAGNOSIS — R590 Localized enlarged lymph nodes: Secondary | ICD-10-CM | POA: Diagnosis not present

## 2017-04-19 DIAGNOSIS — Z8571 Personal history of Hodgkin lymphoma: Secondary | ICD-10-CM | POA: Diagnosis not present

## 2017-04-19 MED ORDER — IOPAMIDOL (ISOVUE-300) INJECTION 61%
75.0000 mL | Freq: Once | INTRAVENOUS | Status: AC | PRN
  Administered 2017-04-19: 75 mL via INTRAVENOUS

## 2017-04-19 MED ORDER — IOPAMIDOL (ISOVUE-300) INJECTION 61%
INTRAVENOUS | Status: AC
  Filled 2017-04-19: qty 75

## 2017-04-26 ENCOUNTER — Ambulatory Visit (HOSPITAL_BASED_OUTPATIENT_CLINIC_OR_DEPARTMENT_OTHER): Payer: BLUE CROSS/BLUE SHIELD | Admitting: Hematology and Oncology

## 2017-04-26 ENCOUNTER — Encounter: Payer: Self-pay | Admitting: Hematology and Oncology

## 2017-04-26 DIAGNOSIS — Z8571 Personal history of Hodgkin lymphoma: Secondary | ICD-10-CM | POA: Diagnosis not present

## 2017-04-26 NOTE — Assessment & Plan Note (Signed)
Clinically, she had no signs of disease. After 2 years, I will discontinue imaging study I will see her back again in 6 months with history, physical examination only along with blood work I would defer imaging study unless she have new symptoms We discussed importance of vitamin D supplementation.

## 2017-04-26 NOTE — Progress Notes (Signed)
[place] OFFICE PROGRESS NOTE  Patient Care Team: Donald Prose, MD as PCP - General (Family Medicine) Izora Gala, MD as Consulting Physician (Otolaryngology)  SUMMARY OF ONCOLOGIC HISTORY:   History of Hodgkin's lymphoma   02/08/2015 Surgery    she underwent excisional lymph node biopsy of the left supraclavicular mass      02/08/2015 Pathology Results    Accession: [id number] Biopsy showed Classical Hodgkin Lymphoma      02/22/2015 Imaging    ECHO is normal      02/25/2015 Procedure    She has port placement      02/26/2015 Miscellaneous    PFT is normal      02/28/2015 Bone Marrow Biopsy    Accession: [id number] BM biopsy was negative      03/01/2015 - 05/29/2015 Chemotherapy    She received ABVD x 3 cycles      04/25/2015 Imaging     PET CT scan showed complete response to treatment.      07/29/2015 Imaging    CT scan showed positive response to Rx with regression in size of mediastinal LN      02/12/2016 Imaging    CT scan is negative for recurrence      10/01/2016 Imaging    CT scan showed continued decrease in volume of lymphoid tissue in the anterior mediastinum. 2. No new adenopathy in the thorax. 3. No evidence of lymphoma in the abdomen pelvis. 4. Normal spleen. 5. No skeletal metastasis.      04/19/2017 Imaging    1. The prevascular lymphadenopathy is stable to slightly decreased in the interval. No new or progressive findings in the chest on today's exam       INTERVAL HISTORY: Please see below for problem oriented charting. She returns for further follow-up She feels well Denies recent chest pain or shortness of breath No new lymphadenopathy  REVIEW OF SYSTEMS:   Constitutional: Denies fevers, chills or abnormal weight loss Eyes: Denies blurriness of vision Ears, nose, mouth, throat, and face: Denies mucositis or sore throat Respiratory: Denies cough, dyspnea or wheezes Cardiovascular: Denies palpitation, chest discomfort or lower  extremity swelling Gastrointestinal:  Denies nausea, heartburn or change in bowel habits Skin: Denies abnormal skin rashes Lymphatics: Denies new lymphadenopathy or easy bruising Neurological:Denies numbness, tingling or new weaknesses Behavioral/Psych: Mood is stable, no new changes  All other systems were reviewed with the patient and are negative.  I have reviewed the past medical history, past surgical history, social history and family history with the patient and they are unchanged from previous note.  ALLERGIES:  has No Known Allergies.  MEDICATIONS:  Current Outpatient Prescriptions  Medication Sig Dispense Refill  . Biotin w/ Vitamins C & E (HAIR/SKIN/NAILS PO) Take by mouth daily.    . cholecalciferol (VITAMIN D) 1000 units tablet Take 1,000 Units by mouth daily.    Marland Kitchen etonogestrel-ethinyl estradiol (NUVARING) 0.12-0.015 MG/24HR vaginal ring Place 1 each vaginally every 28 (twenty-eight) days. Insert vaginally and leave in place for 3 consecutive weeks, then remove for 1 week.    . vitamin B-12 (CYANOCOBALAMIN) 1000 MCG tablet Take 1,000 mcg by mouth daily.     No current facility-administered medications for this visit.     PHYSICAL EXAMINATION: ECOG PERFORMANCE STATUS: 0 - Asymptomatic  Vitals:   04/26/17 1224  BP: 124/73  Pulse: 82  Resp: 18  Temp: 97.9 F (36.6 C)   Filed Weights   04/26/17 1224  Weight: 126 lb 14.4 oz (57.6 kg)  GENERAL:alert, no distress and comfortable SKIN: skin color, texture, turgor are normal, no rashes or significant lesions EYES: normal, Conjunctiva are pink and non-injected, sclera clear OROPHARYNX:no exudate, no erythema and lips, buccal mucosa, and tongue normal  NECK: supple, thyroid normal size, non-tender, without nodularity LYMPH:  no palpable lymphadenopathy in the cervical, axillary or inguinal LUNGS: clear to auscultation and percussion with normal breathing effort HEART: regular rate & rhythm and no murmurs and no lower  extremity edema ABDOMEN:abdomen soft, non-tender and normal bowel sounds Musculoskeletal:no cyanosis of digits and no clubbing  NEURO: alert & oriented x 3 with fluent speech, no focal motor/sensory deficits  LABORATORY DATA:  I have reviewed the data as listed    Component Value Date/Time   NA 137 04/09/2017 1412   K 3.8 04/09/2017 1412   CL 108 08/09/2015 1226   CO2 22 04/09/2017 1412   GLUCOSE 86 04/09/2017 1412   BUN 11.3 04/09/2017 1412   CREATININE 0.8 04/09/2017 1412   CALCIUM 9.1 04/09/2017 1412   PROT 7.1 04/09/2017 1412   ALBUMIN 3.7 04/09/2017 1412   AST 15 04/09/2017 1412   ALT 10 04/09/2017 1412   ALKPHOS 52 04/09/2017 1412   BILITOT 0.43 04/09/2017 1412   GFRNONAA >60 08/09/2015 1226   GFRAA >60 08/09/2015 1226    No results found for: SPEP, UPEP  Lab Results  Component Value Date   WBC 6.3 04/09/2017   NEUTROABS 3.7 04/09/2017   HGB 13.3 04/09/2017   HCT 39.1 04/09/2017   MCV 94.0 04/09/2017   PLT 252 04/09/2017      Chemistry      Component Value Date/Time   NA 137 04/09/2017 1412   K 3.8 04/09/2017 1412   CL 108 08/09/2015 1226   CO2 22 04/09/2017 1412   BUN 11.3 04/09/2017 1412   CREATININE 0.8 04/09/2017 1412      Component Value Date/Time   CALCIUM 9.1 04/09/2017 1412   ALKPHOS 52 04/09/2017 1412   AST 15 04/09/2017 1412   ALT 10 04/09/2017 1412   BILITOT 0.43 04/09/2017 1412       RADIOGRAPHIC STUDIES: I have personally reviewed the radiological images as listed and agreed with the findings in the report. Ct Chest W Contrast  Result Date: 04/19/2017 CLINICAL DATA:  High since lymphoma. EXAM: CT CHEST WITH CONTRAST TECHNIQUE: Multidetector CT imaging of the chest was performed during intravenous contrast administration. CONTRAST:  36m ISOVUE-300 IOPAMIDOL (ISOVUE-300) INJECTION 61% COMPARISON:  10/01/2016 FINDINGS: Cardiovascular: The heart size is normal. No pericardial effusion. No thoracic aortic aneurysm. Mediastinum/Nodes:  Prevascular nodal disease in the anterior mediastinum is similar to slightly decreased in the interval measuring 11 x 17 mm today compared to 13 x 22 mm previously. No other mediastinal lymphadenopathy. There is no hilar lymphadenopathy. There is no axillary lymphadenopathy. The esophagus has normal imaging features. Lungs/Pleura: No focal airspace consolidation. No pulmonary edema or pleural effusion. No suspicious pulmonary nodule or mass. Upper Abdomen: Unremarkable. 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 IMPRESSION: 1. The prevascular lymphadenopathy is stable to slightly decreased in the interval. No new or progressive findings in the chest on today's exam. Electronically Signed   By: EMisty StanleyM.D.   On: 04/19/2017 17:05    ASSESSMENT & PLAN:  History of hodgkin's lymphoma Clinically, she had no signs of disease. After 2 years, I will discontinue imaging study I will see her back again in 6 months with history, physical examination only along with blood work I  would defer imaging study unless she have new symptoms We discussed importance of vitamin D supplementation.   No orders of the defined types were placed in this encounter.  All questions were answered. The patient knows to call the clinic with any problems, questions or concerns. No barriers to learning was detected. I spent 10 minutes counseling the patient face to face. The total time spent in the appointment was 15 minutes and more than 50% was on counseling and review of test results     Heath Lark, MD 04/26/2017 5:08 PM

## 2017-05-03 ENCOUNTER — Telehealth: Payer: Self-pay

## 2017-05-03 NOTE — Telephone Encounter (Signed)
Spoke with patient concerning upcoming appointment for 10/23/2017

## 2017-10-26 ENCOUNTER — Other Ambulatory Visit: Payer: Self-pay | Admitting: Hematology and Oncology

## 2017-10-26 DIAGNOSIS — Z8571 Personal history of Hodgkin lymphoma: Secondary | ICD-10-CM

## 2017-10-26 DIAGNOSIS — C8111 Nodular sclerosis classical Hodgkin lymphoma, lymph nodes of head, face, and neck: Secondary | ICD-10-CM

## 2017-10-27 ENCOUNTER — Ambulatory Visit: Payer: BLUE CROSS/BLUE SHIELD | Admitting: Hematology and Oncology

## 2017-10-27 ENCOUNTER — Other Ambulatory Visit: Payer: BLUE CROSS/BLUE SHIELD

## 2019-09-06 ENCOUNTER — Telehealth: Payer: Self-pay | Admitting: Hematology and Oncology

## 2019-09-06 NOTE — Telephone Encounter (Signed)
Returned patient's phone call regarding rescheduling missed February 2019 appointment, per patient's request appointment has been rescheduled for 12/28.

## 2019-09-14 ENCOUNTER — Other Ambulatory Visit: Payer: Self-pay | Admitting: *Deleted

## 2019-09-14 DIAGNOSIS — Z8571 Personal history of Hodgkin lymphoma: Secondary | ICD-10-CM

## 2019-09-18 ENCOUNTER — Encounter: Payer: Self-pay | Admitting: Hematology and Oncology

## 2019-09-18 ENCOUNTER — Inpatient Hospital Stay: Payer: Managed Care, Other (non HMO) | Admitting: Hematology and Oncology

## 2019-09-18 ENCOUNTER — Inpatient Hospital Stay: Payer: Managed Care, Other (non HMO) | Attending: Hematology and Oncology

## 2019-09-18 ENCOUNTER — Other Ambulatory Visit: Payer: Self-pay

## 2019-09-18 DIAGNOSIS — Z79899 Other long term (current) drug therapy: Secondary | ICD-10-CM | POA: Insufficient documentation

## 2019-09-18 DIAGNOSIS — Z8571 Personal history of Hodgkin lymphoma: Secondary | ICD-10-CM

## 2019-09-18 DIAGNOSIS — R6884 Jaw pain: Secondary | ICD-10-CM | POA: Diagnosis not present

## 2019-09-18 DIAGNOSIS — Z793 Long term (current) use of hormonal contraceptives: Secondary | ICD-10-CM | POA: Insufficient documentation

## 2019-09-18 DIAGNOSIS — Z9221 Personal history of antineoplastic chemotherapy: Secondary | ICD-10-CM | POA: Diagnosis not present

## 2019-09-18 DIAGNOSIS — G8929 Other chronic pain: Secondary | ICD-10-CM

## 2019-09-18 LAB — CMP (CANCER CENTER ONLY)
ALT: 14 U/L (ref 0–44)
AST: 18 U/L (ref 15–41)
Albumin: 3.8 g/dL (ref 3.5–5.0)
Alkaline Phosphatase: 62 U/L (ref 38–126)
Anion gap: 10 (ref 5–15)
BUN: 9 mg/dL (ref 6–20)
CO2: 23 mmol/L (ref 22–32)
Calcium: 8.8 mg/dL — ABNORMAL LOW (ref 8.9–10.3)
Chloride: 106 mmol/L (ref 98–111)
Creatinine: 0.71 mg/dL (ref 0.44–1.00)
GFR, Est AFR Am: >60 mL/min (ref >60)
GFR, Estimated: >60 mL/min (ref >60)
Glucose, Bld: 112 mg/dL — ABNORMAL HIGH (ref 70–99)
Potassium: 4.1 mmol/L (ref 3.5–5.1)
Sodium: 139 mmol/L (ref 135–145)
Total Bilirubin: 0.3 mg/dL (ref 0.3–1.2)
Total Protein: 7.2 g/dL (ref 6.5–8.1)

## 2019-09-18 LAB — CBC WITH DIFFERENTIAL (CANCER CENTER ONLY)
Abs Immature Granulocytes: 0.02 10*3/uL (ref 0.00–0.07)
Basophils Absolute: 0 10*3/uL (ref 0.0–0.1)
Basophils Relative: 1 %
Eosinophils Absolute: 0.1 10*3/uL (ref 0.0–0.5)
Eosinophils Relative: 2 %
HCT: 43 % (ref 36.0–46.0)
Hemoglobin: 14.1 g/dL (ref 12.0–15.0)
Immature Granulocytes: 0 %
Lymphocytes Relative: 35 %
Lymphs Abs: 2.6 10*3/uL (ref 0.7–4.0)
MCH: 31.2 pg (ref 26.0–34.0)
MCHC: 32.8 g/dL (ref 30.0–36.0)
MCV: 95.1 fL (ref 80.0–100.0)
Monocytes Absolute: 0.4 10*3/uL (ref 0.1–1.0)
Monocytes Relative: 5 %
Neutro Abs: 4.4 10*3/uL (ref 1.7–7.7)
Neutrophils Relative %: 57 %
Platelet Count: 329 10*3/uL (ref 150–400)
RBC: 4.52 MIL/uL (ref 3.87–5.11)
RDW: 13.3 % (ref 11.5–15.5)
WBC Count: 7.5 10*3/uL (ref 4.0–10.5)
nRBC: 0 % (ref 0.0–0.2)

## 2019-09-18 LAB — LACTATE DEHYDROGENASE: LDH: 156 U/L (ref 98–192)

## 2019-09-18 NOTE — Assessment & Plan Note (Signed)
She has no signs or symptoms to suggest cancer recurrence The patient is educated to watch out for signs and symptoms of disease relapse She is more than 4 years out from last treatment I recommend close follow-up with primary care doctor in the future I would discontinue long-term follow-up here but she is advised to call me back if she have new symptoms We discussed the importance of annual influenza vaccination and age-appropriate screening programs

## 2019-09-18 NOTE — Progress Notes (Signed)
[place] OFFICE PROGRESS NOTE  Patient Care Team: Donald Prose, MD as PCP - General (Family Medicine) Izora Gala, MD as Consulting Physician (Otolaryngology)  ASSESSMENT & PLAN:  History of hodgkin's lymphoma She has no signs or symptoms to suggest cancer recurrence The patient is educated to watch out for signs and symptoms of disease relapse She is more than 4 years out from last treatment I recommend close follow-up with primary care doctor in the future I would discontinue long-term follow-up here but she is advised to call me back if she have new symptoms We discussed the importance of annual influenza vaccination and age-appropriate screening programs  Chronic jaw pain She continues to have chronic intermittent discomfort over the left jaw close to the site of prior disease Examination is benign The patient is reassured   No orders of the defined types were placed in this encounter.   INTERVAL HISTORY: Please see below for problem oriented charting. The patient returns for further follow-up I have not seen her for almost 2 years Since last time I saw her, she feels well She continues to have intermittent mild discomfort over the left jaw area but denies new lymphadenopathy Denies abnormal night sweats No recent weight loss, in fact, she has gained quite a bit of weight since last time I saw her No recent infection, fever or chills  SUMMARY OF ONCOLOGIC HISTORY: Oncology History  History of Hodgkin's lymphoma  02/08/2015 Surgery   she underwent excisional lymph node biopsy of the left supraclavicular mass   02/08/2015 Pathology Results   Accession: [id number] Biopsy showed Classical Hodgkin Lymphoma   02/22/2015 Imaging   ECHO is normal   02/25/2015 Procedure   She has port placement   02/26/2015 Miscellaneous   PFT is normal   02/28/2015 Bone Marrow Biopsy   Accession: [id number] BM biopsy was negative   03/01/2015 - 05/29/2015 Chemotherapy   She  received ABVD x 3 cycles   04/25/2015 Imaging    PET CT scan showed complete response to treatment.   07/29/2015 Imaging   CT scan showed positive response to Rx with regression in size of mediastinal LN   02/12/2016 Imaging   CT scan is negative for recurrence   10/01/2016 Imaging   CT scan showed continued decrease in volume of lymphoid tissue in the anterior mediastinum. 2. No new adenopathy in the thorax. 3. No evidence of lymphoma in the abdomen pelvis. 4. Normal spleen. 5. No skeletal metastasis.   04/19/2017 Imaging   1. The prevascular lymphadenopathy is stable to slightly decreased in the interval. No new or progressive findings in the chest on today's exam     REVIEW OF SYSTEMS:   Constitutional: Denies fevers, chills or abnormal weight loss Eyes: Denies blurriness of vision Ears, nose, mouth, throat, and face: Denies mucositis or sore throat Respiratory: Denies cough, dyspnea or wheezes Cardiovascular: Denies palpitation, chest discomfort or lower extremity swelling Gastrointestinal:  Denies nausea, heartburn or change in bowel habits Skin: Denies abnormal skin rashes Lymphatics: Denies new lymphadenopathy or easy bruising Neurological:Denies numbness, tingling or new weaknesses Behavioral/Psych: Mood is stable, no new changes  All other systems were reviewed with the patient and are negative.  I have reviewed the past medical history, past surgical history, social history and family history with the patient and they are unchanged from previous note.  ALLERGIES:  has No Known Allergies.  MEDICATIONS:  Current Outpatient Medications  Medication Sig Dispense Refill  . levonorgestrel-ethinyl estradiol (LYBREL) 90-20 MCG tablet  Take 1 tablet by mouth daily.    . Biotin w/ Vitamins C & E (HAIR/SKIN/NAILS PO) Take by mouth daily.    . cholecalciferol (VITAMIN D) 1000 units tablet Take 1,000 Units by mouth daily.    . vitamin B-12 (CYANOCOBALAMIN) 1000 MCG tablet Take 1,000  mcg by mouth daily.     No current facility-administered medications for this visit.    PHYSICAL EXAMINATION: ECOG PERFORMANCE STATUS: 0 - Asymptomatic  Vitals:   09/18/19 1324  BP: 117/77  Pulse: 77  Resp: 18  Temp: 97.8 F (36.6 C)  SpO2: 99%   Filed Weights   09/18/19 1324  Weight: 166 lb (75.3 kg)    GENERAL:alert, no distress and comfortable SKIN: skin color, texture, turgor are normal, no rashes or significant lesions EYES: normal, Conjunctiva are pink and non-injected, sclera clear OROPHARYNX:no exudate, no erythema and lips, buccal mucosa, and tongue normal  NECK: supple, thyroid normal size, non-tender, without nodularity LYMPH:  no palpable lymphadenopathy in the cervical, axillary or inguinal LUNGS: clear to auscultation and percussion with normal breathing effort HEART: regular rate & rhythm and no murmurs and no lower extremity edema ABDOMEN:abdomen soft, non-tender and normal bowel sounds Musculoskeletal:no cyanosis of digits and no clubbing  NEURO: alert & oriented x 3 with fluent speech, no focal motor/sensory deficits  LABORATORY DATA:  I have reviewed the data as listed    Component Value Date/Time   NA 139 09/18/2019 1252   NA 137 04/09/2017 1412   K 4.1 09/18/2019 1252   K 3.8 04/09/2017 1412   CL 106 09/18/2019 1252   CO2 23 09/18/2019 1252   CO2 22 04/09/2017 1412   GLUCOSE 112 (H) 09/18/2019 1252   GLUCOSE 86 04/09/2017 1412   BUN 9 09/18/2019 1252   BUN 11.3 04/09/2017 1412   CREATININE 0.71 09/18/2019 1252   CREATININE 0.8 04/09/2017 1412   CALCIUM 8.8 (L) 09/18/2019 1252   CALCIUM 9.1 04/09/2017 1412   PROT 7.2 09/18/2019 1252   PROT 7.1 04/09/2017 1412   ALBUMIN 3.8 09/18/2019 1252   ALBUMIN 3.7 04/09/2017 1412   AST 18 09/18/2019 1252   AST 15 04/09/2017 1412   ALT 14 09/18/2019 1252   ALT 10 04/09/2017 1412   ALKPHOS 62 09/18/2019 1252   ALKPHOS 52 04/09/2017 1412   BILITOT 0.3 09/18/2019 1252   BILITOT 0.43 04/09/2017 1412    GFRNONAA >60 09/18/2019 1252   GFRAA >60 09/18/2019 1252    No results found for: SPEP, UPEP  Lab Results  Component Value Date   WBC 7.5 09/18/2019   NEUTROABS 4.4 09/18/2019   HGB 14.1 09/18/2019   HCT 43.0 09/18/2019   MCV 95.1 09/18/2019   PLT 329 09/18/2019      Chemistry      Component Value Date/Time   NA 139 09/18/2019 1252   NA 137 04/09/2017 1412   K 4.1 09/18/2019 1252   K 3.8 04/09/2017 1412   CL 106 09/18/2019 1252   CO2 23 09/18/2019 1252   CO2 22 04/09/2017 1412   BUN 9 09/18/2019 1252   BUN 11.3 04/09/2017 1412   CREATININE 0.71 09/18/2019 1252   CREATININE 0.8 04/09/2017 1412      Component Value Date/Time   CALCIUM 8.8 (L) 09/18/2019 1252   CALCIUM 9.1 04/09/2017 1412   ALKPHOS 62 09/18/2019 1252   ALKPHOS 52 04/09/2017 1412   AST 18 09/18/2019 1252   AST 15 04/09/2017 1412   ALT 14 09/18/2019 1252   ALT  10 04/09/2017 1412   BILITOT 0.3 09/18/2019 1252   BILITOT 0.43 04/09/2017 1412       All questions were answered. The patient knows to call the clinic with any problems, questions or concerns. No barriers to learning was detected.  I spent 15 minutes counseling the patient face to face. The total time spent in the appointment was 20 minutes and more than 50% was on counseling and review of test results  Heath Lark, MD 09/18/2019 2:45 PM

## 2019-09-18 NOTE — Assessment & Plan Note (Signed)
She continues to have chronic intermittent discomfort over the left jaw close to the site of prior disease Examination is benign The patient is reassured

## 2020-01-30 ENCOUNTER — Encounter: Payer: Self-pay | Admitting: Internal Medicine

## 2020-02-01 ENCOUNTER — Other Ambulatory Visit: Payer: Self-pay | Admitting: Internal Medicine

## 2020-02-01 DIAGNOSIS — R109 Unspecified abdominal pain: Secondary | ICD-10-CM

## 2020-02-01 DIAGNOSIS — C8199 Hodgkin lymphoma, unspecified, extranodal and solid organ sites: Secondary | ICD-10-CM

## 2020-02-16 ENCOUNTER — Ambulatory Visit
Admission: RE | Admit: 2020-02-16 | Discharge: 2020-02-16 | Disposition: A | Discharge status: Home / Self Care | Payer: Managed Care, Other (non HMO) | Source: Ambulatory Visit | Attending: Internal Medicine | Admitting: Internal Medicine

## 2020-02-16 DIAGNOSIS — C8199 Hodgkin lymphoma, unspecified, extranodal and solid organ sites: Secondary | ICD-10-CM

## 2020-02-16 DIAGNOSIS — R109 Unspecified abdominal pain: Secondary | ICD-10-CM

## 2020-03-08 ENCOUNTER — Other Ambulatory Visit: Payer: Self-pay | Admitting: Internal Medicine

## 2020-03-08 DIAGNOSIS — K769 Liver disease, unspecified: Secondary | ICD-10-CM

## 2020-03-29 ENCOUNTER — Ambulatory Visit
Admission: RE | Admit: 2020-03-29 | Discharge: 2020-03-29 | Disposition: A | Discharge status: Home / Self Care | Payer: Managed Care, Other (non HMO) | Source: Ambulatory Visit | Attending: Internal Medicine | Admitting: Internal Medicine

## 2020-03-29 DIAGNOSIS — K769 Liver disease, unspecified: Secondary | ICD-10-CM

## 2020-03-29 MED ORDER — IOPAMIDOL (ISOVUE-300) INJECTION 61%
100.0000 mL | Freq: Once | INTRAVENOUS | Status: AC | PRN
  Administered 2020-03-29: 100 mL via INTRAVENOUS

## 2020-07-10 IMAGING — CT CT ABDOMEN W/ CM
2 of 4 series · 16 of 46 positions shown, 18 images · IV contrast (iopamidol)
Comparison: Ultrasound on 02/16/2020

CLINICAL DATA: Abdominal pain. Possible hepatic lesion on recent
ultrasound. Personal history of Hodgkin lymphoma.

EXAM:
CT ABDOMEN WITH CONTRAST
TECHNIQUE: Multidetector CT imaging of the abdomen was performed using the
standard protocol following bolus administration of intravenous
contrast.
CONTRAST:  100mL X9ZZIR-2JJ IOPAMIDOL (X9ZZIR-2JJ) INJECTION 61%

[Series 2: abd with 5.00 br40 s3 axial · axial · 0.83mm/px · z∈[+1346,+1601]mm · 13 of 57 slices shown, 15 images]
[im 3/57  soft-tissue]
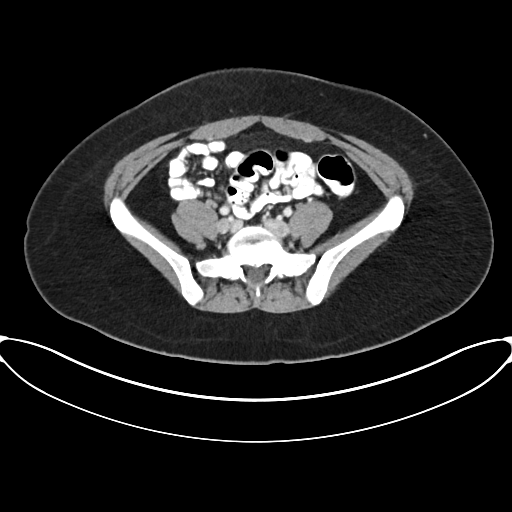
[im 3/57  bone]
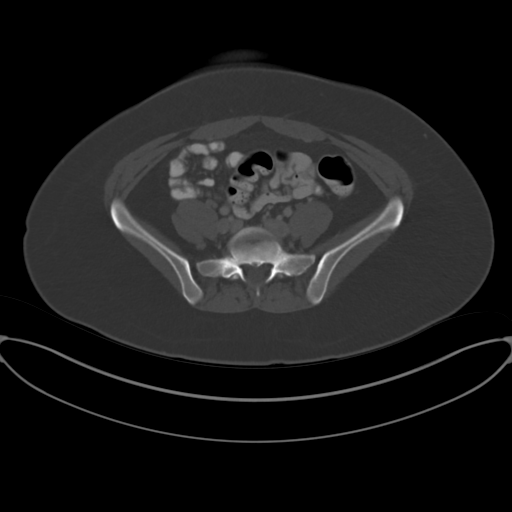
[im 9/57  soft-tissue]
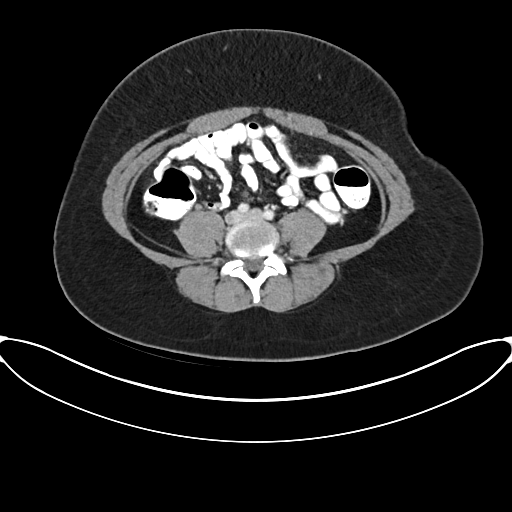
[im 11/57  soft-tissue]
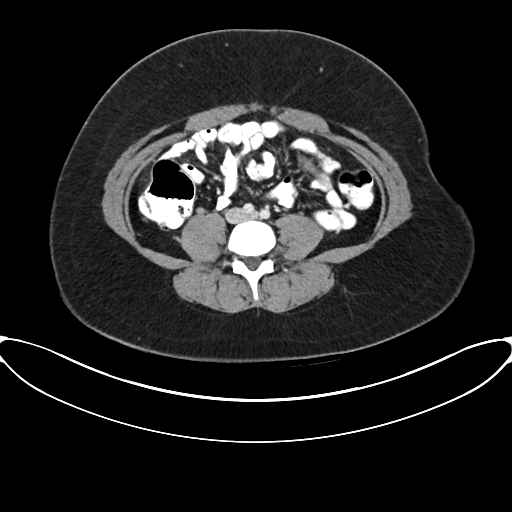
[im 17/57  soft-tissue]
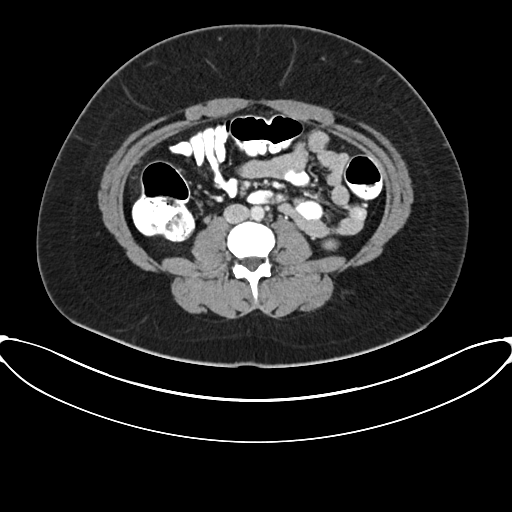
[im 19/57  soft-tissue]
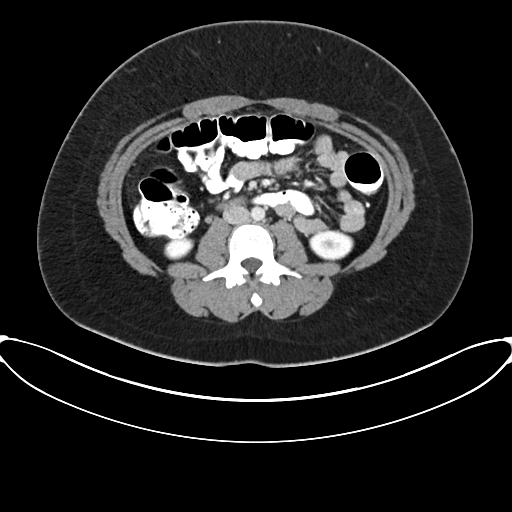
[im 25/57  soft-tissue]
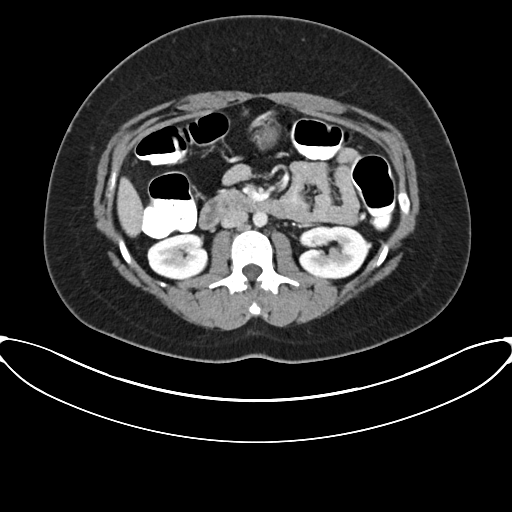
[im 30/57  soft-tissue]
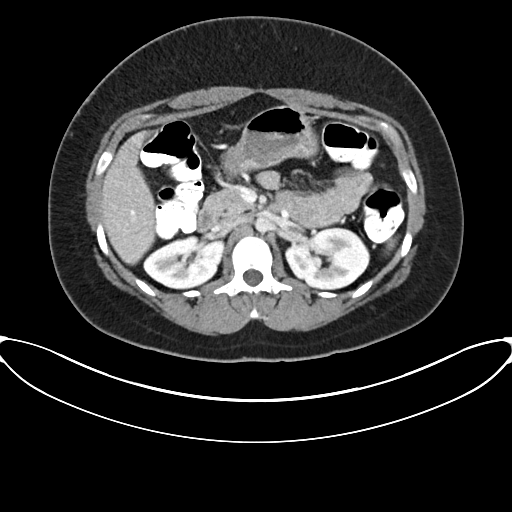
[im 33/57  soft-tissue]
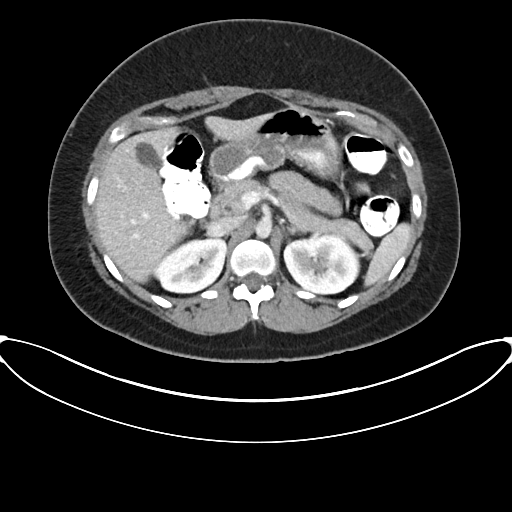
[im 38/57  soft-tissue]
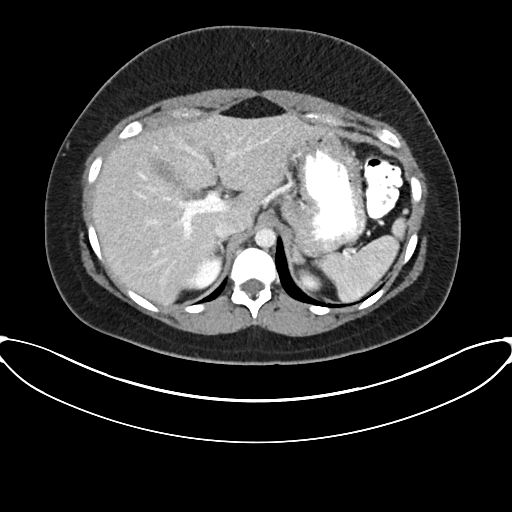
[im 38/57  bone]
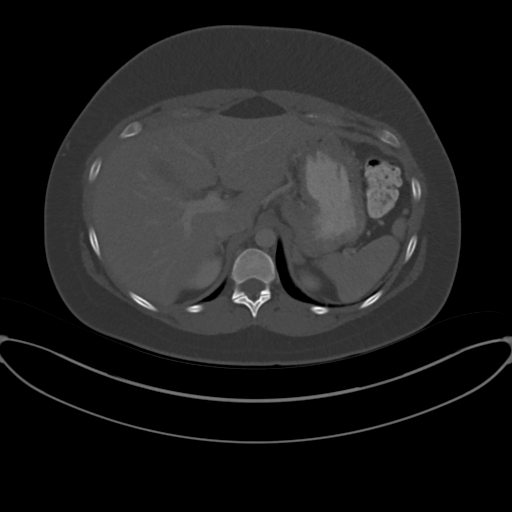
[im 41/57  soft-tissue]
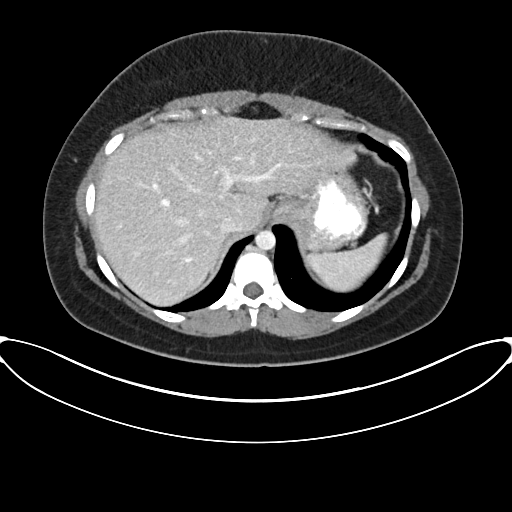
[im 46/57  soft-tissue]
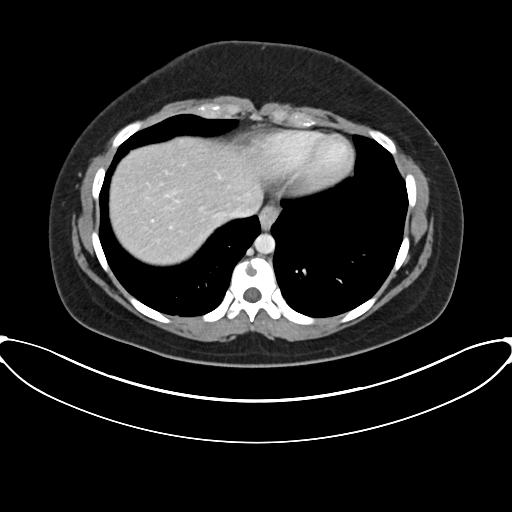
[im 49/57  soft-tissue]
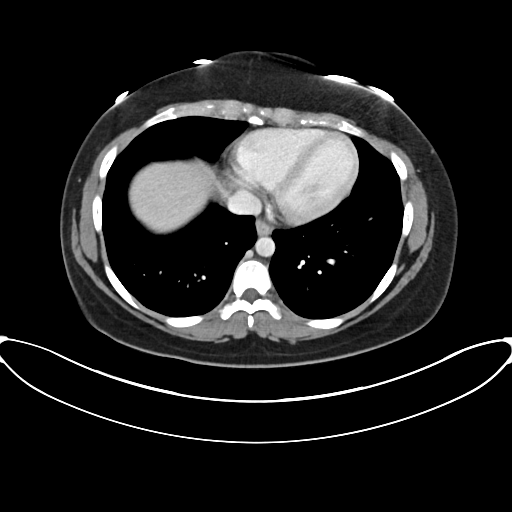
[im 54/57  soft-tissue]
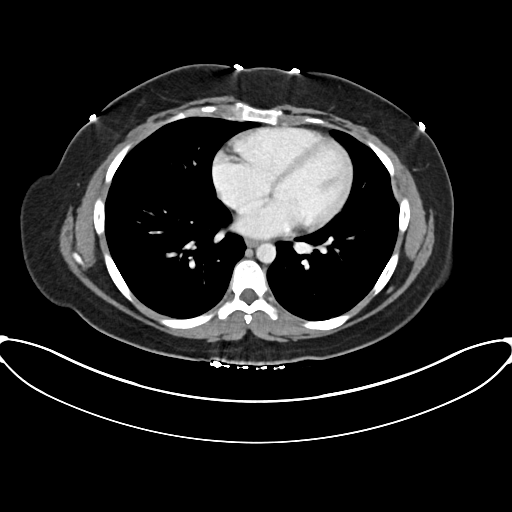

[Series 8: abd with 2.00 br40 s3 cor · coronal · 0.56mm/px · 3 of 172 slices shown]
[im 58/172  soft-tissue]
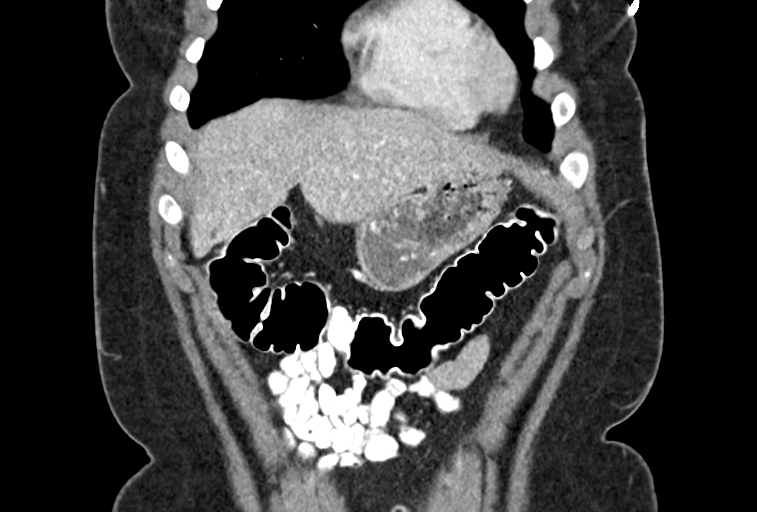
[im 77/172  soft-tissue]
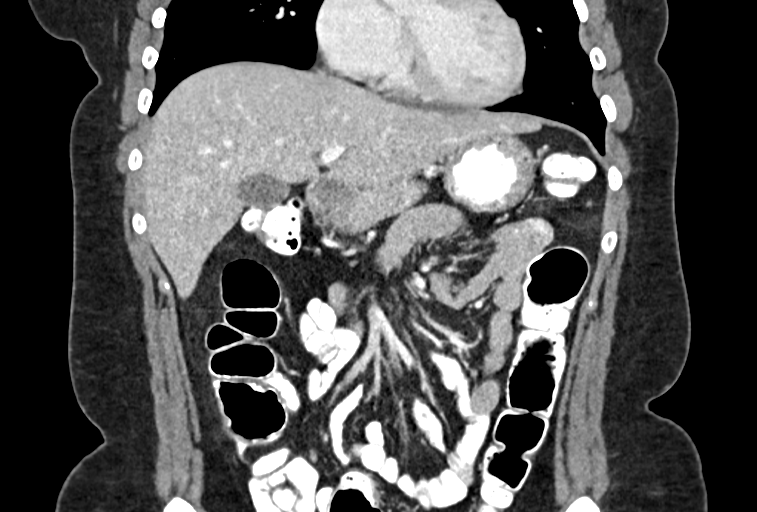
[im 96/172  soft-tissue]
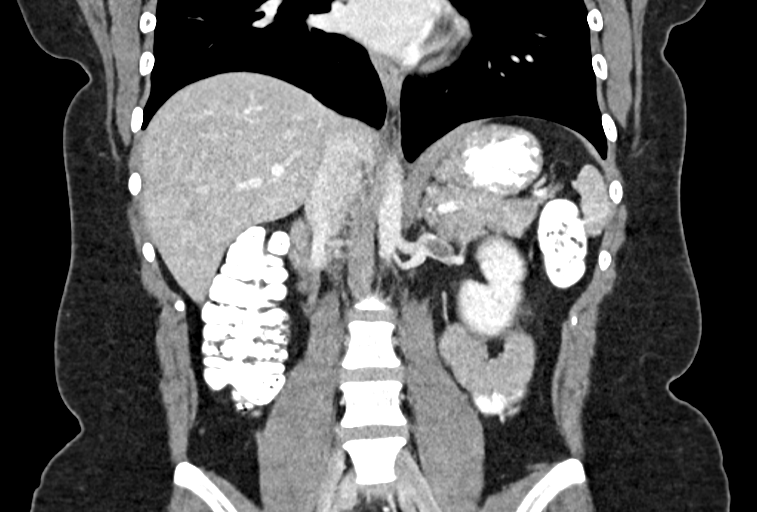

[16 of 46 positions shown; findings below may reference images not displayed]

FINDINGS: Lower chest: No acute findings.

Hepatobiliary: No evidence of hepatic mass or other significant
abnormality. Gallbladder is unremarkable. No evidence of biliary
ductal dilatation.

Pancreas:  No mass or inflammatory changes.

Spleen:  Within normal limits in size and appearance.

Adrenals/Urinary Tract: No masses identified. No evidence of
hydronephrosis.

Stomach/Bowel: Visualized portion unremarkable.

Vascular/Lymphatic: No pathologically enlarged lymph nodes
identified. No abdominal aortic aneurysm.

Other:  None.

Musculoskeletal: No suspicious bone lesions identified. Congenital
butterfly vertebra is seen at level of T10.
IMPRESSION: No evidence of hepatic mass or other acute findings.

Congenital butterfly vertebra incidentally noted at T10.

## 2020-09-24 ENCOUNTER — Telehealth: Payer: Self-pay

## 2020-09-24 NOTE — Telephone Encounter (Signed)
Victorino Dike with [facility] called and left a message to call the office back. Mallory Price tested positive today for Covid. They are calling to make Dr. Bertis Ruddy aware and to if she qualifies for the monoclonal infusion.  Onset of symptoms 2 days ago. Currently she is having body aches, chest congestion, shortness of breath with oxygen saturation of 99% in office at [facility].    Message to the [facility] infusion to see if she qualifies for treatment.

## 2020-09-25 ENCOUNTER — Ambulatory Visit (HOSPITAL_COMMUNITY)
Admission: RE | Admit: 2020-09-25 | Discharge: 2020-09-25 | Disposition: A | Discharge status: Home / Self Care | Payer: Managed Care, Other (non HMO) | Source: Ambulatory Visit | Attending: Pulmonary Disease | Admitting: Pulmonary Disease

## 2020-09-25 ENCOUNTER — Other Ambulatory Visit: Payer: Self-pay | Admitting: Physician Assistant

## 2020-09-25 DIAGNOSIS — Z8571 Personal history of Hodgkin lymphoma: Secondary | ICD-10-CM

## 2020-09-25 DIAGNOSIS — U071 COVID-19: Secondary | ICD-10-CM | POA: Diagnosis present

## 2020-09-25 MED ORDER — FAMOTIDINE IN NACL 20-0.9 MG/50ML-% IV SOLN: 20.0000 mg | Freq: Once | INTRAVENOUS | Status: DC | PRN

## 2020-09-25 MED ORDER — SODIUM CHLORIDE 0.9 % IV SOLN: INTRAVENOUS | Status: DC | PRN

## 2020-09-25 MED ORDER — METHYLPREDNISOLONE SODIUM SUCC 125 MG IJ SOLR: 125.0000 mg | Freq: Once | INTRAMUSCULAR | Status: DC | PRN

## 2020-09-25 MED ORDER — SOTROVIMAB 500 MG/8ML IV SOLN
500.0000 mg | Freq: Once | INTRAVENOUS | Status: AC
  Administered 2020-09-25: 500 mg via INTRAVENOUS

## 2020-09-25 MED ORDER — ALBUTEROL SULFATE HFA 108 (90 BASE) MCG/ACT IN AERS: 2.0000 | INHALATION_SPRAY | Freq: Once | RESPIRATORY_TRACT | Status: DC | PRN

## 2020-09-25 MED ORDER — DIPHENHYDRAMINE HCL 50 MG/ML IJ SOLN: 50.0000 mg | Freq: Once | INTRAMUSCULAR | Status: DC | PRN

## 2020-09-25 MED ORDER — EPINEPHRINE 0.3 MG/0.3ML IJ SOAJ: 0.3000 mg | Freq: Once | INTRAMUSCULAR | Status: DC | PRN

## 2020-09-25 NOTE — Progress Notes (Signed)
Diagnosis: COVID-19  Physician: Dr. Patrick Wright  Procedure: Covid Infusion Clinic Med: Sotrovimab infusion - Provided patient with sotrovimab fact sheet for patients, parents, and caregivers prior to infusion.   Complications: No immediate complications noted  Discharge: Discharged home    

## 2020-09-25 NOTE — Progress Notes (Signed)
Patient reviewed Fact Sheet for Patients, Parents, and Caregivers for Emergency Use Authorization (EUA) of sotrovimab for the Treatment of Coronavirus. Patient also reviewed and is agreeable to the estimated cost of treatment. Patient is agreeable to proceed.   

## 2020-09-25 NOTE — Discharge Instructions (Signed)
10 Things You Can Do to Manage Your COVID-19 Symptoms at Home If you have possible or confirmed COVID-19: 1. Stay home from work and school. And stay away from other public places. If you must go out, avoid using any kind of public transportation, ridesharing, or taxis. 2. Monitor your symptoms carefully. If your symptoms get worse, call your healthcare provider immediately. 3. Get rest and stay hydrated. 4. If you have a medical appointment, call the healthcare provider ahead of time and tell them that you have or may have COVID-19. 5. For medical emergencies, call 911 and notify the dispatch personnel that you have or may have COVID-19. 6. Cover your cough and sneezes with a tissue or use the inside of your elbow. 7. Wash your hands often with soap and water for at least 20 seconds or clean your hands with an alcohol-based hand sanitizer that contains at least 60% alcohol. 8. As much as possible, stay in a specific room and away from other people in your home. Also, you should use a separate bathroom, if available. If you need to be around other people in or outside of the home, wear a mask. 9. Avoid sharing personal items with other people in your household, like dishes, towels, and bedding. 10. Clean all surfaces that are touched often, like counters, tabletops, and doorknobs. Use household cleaning sprays or wipes according to the label instructions. cdc.gov/coronavirus 03/22/2019 This information is not intended to replace advice given to you by your health care provider. Make sure you discuss any questions you have with your health care provider. Document Revised: 08/24/2019 Document Reviewed: 08/24/2019 Elsevier Patient Education  2020 Elsevier Inc. What types of side effects do monoclonal antibody drugs cause?  Common side effects  In general, the more common side effects caused by monoclonal antibody drugs include: . Allergic reactions, such as hives or itching . Flu-like signs and  symptoms, including chills, fatigue, fever, and muscle aches and pains . Nausea, vomiting . Diarrhea . Skin rashes . Low blood pressure   The CDC is recommending patients who receive monoclonal antibody treatments wait at least 90 days before being vaccinated.  Currently, there are no data on the safety and efficacy of mRNA COVID-19 vaccines in persons who received monoclonal antibodies or convalescent plasma as part of COVID-19 treatment. Based on the estimated half-life of such therapies as well as evidence suggesting that reinfection is uncommon in the 90 days after initial infection, vaccination should be deferred for at least 90 days, as a precautionary measure until additional information becomes available, to avoid interference of the antibody treatment with vaccine-induced immune responses. If you have any questions or concerns after the infusion please call the Advanced Practice Provider on call at [phone]. This number is ONLY intended for your use regarding questions or concerns about the infusion post-treatment side-effects.  Please do not provide this number to others for use. For return to work notes please contact your primary care provider.   If someone you know is interested in receiving treatment please have them call the COVID hotline at [phone].   

## 2020-09-25 NOTE — Progress Notes (Signed)
I connected by phone with Mallory Price on 09/25/2020 at 1:12 PM to discuss the potential use of a new treatment for mild to moderate COVID-19 viral infection in non-hospitalized patients.  This patient is a 30 y.o. female that meets the FDA criteria for Emergency Use Authorization of COVID monoclonal antibody casirivimab/imdevimab, bamlanivimab/etesevimab, or sotrovimab.  Has a (+) direct SARS-CoV-2 viral test result  Has mild or moderate COVID-19   Is NOT hospitalized due to COVID-19  Is within 10 days of symptom onset  Has at least one of the high risk factor(s) for progression to severe COVID-19 and/or hospitalization as defined in EUA.  Specific high risk criteria : BMI > 25, Immunosuppressive Disease or Treatment and Other high risk medical condition per CDC:  Hispanic   I have spoken and communicated the following to the patient or parent/caregiver regarding COVID monoclonal antibody treatment:  1. FDA has authorized the emergency use for the treatment of mild to moderate COVID-19 in adults and pediatric patients with positive results of direct SARS-CoV-2 viral testing who are 58 years of age and older weighing at least 40 kg, and who are at high risk for progressing to severe COVID-19 and/or hospitalization.  2. The significant known and potential risks and benefits of COVID monoclonal antibody, and the extent to which such potential risks and benefits are unknown.  3. Information on available alternative treatments and the risks and benefits of those alternatives, including clinical trials.  4. Patients treated with COVID monoclonal antibody should continue to self-isolate and use infection control measures (e.g., wear mask, isolate, social distance, avoid sharing personal items, clean and disinfect "high touch" surfaces, and frequent handwashing) according to CDC guidelines.   5. The patient or parent/caregiver has the option to accept or refuse COVID monoclonal antibody  treatment.  After reviewing this information with the patient, the patient has agreed to receive one of the available covid 19 monoclonal antibodies and will be provided an appropriate fact sheet prior to infusion.   Today is day 6.   [place], [place] 09/25/2020 1:12 PM

## 2021-03-25 DIAGNOSIS — J029 Acute pharyngitis, unspecified: Secondary | ICD-10-CM | POA: Diagnosis not present

## 2021-03-25 DIAGNOSIS — C8199 Hodgkin lymphoma, unspecified, extranodal and solid organ sites: Secondary | ICD-10-CM | POA: Diagnosis not present

## 2021-03-26 DIAGNOSIS — Z20822 Contact with and (suspected) exposure to covid-19: Secondary | ICD-10-CM | POA: Diagnosis not present

## 2021-04-10 DIAGNOSIS — Z01419 Encounter for gynecological examination (general) (routine) without abnormal findings: Secondary | ICD-10-CM | POA: Diagnosis not present

## 2021-04-10 DIAGNOSIS — Z6833 Body mass index (BMI) 33.0-33.9, adult: Secondary | ICD-10-CM | POA: Diagnosis not present

## 2021-04-10 DIAGNOSIS — Z113 Encounter for screening for infections with a predominantly sexual mode of transmission: Secondary | ICD-10-CM | POA: Diagnosis not present

## 2021-04-10 DIAGNOSIS — Z3040 Encounter for surveillance of contraceptives, unspecified: Secondary | ICD-10-CM | POA: Diagnosis not present

## 2021-04-21 DIAGNOSIS — H109 Unspecified conjunctivitis: Secondary | ICD-10-CM | POA: Diagnosis not present

## 2021-06-04 DIAGNOSIS — R070 Pain in throat: Secondary | ICD-10-CM | POA: Diagnosis not present

## 2021-06-04 DIAGNOSIS — K219 Gastro-esophageal reflux disease without esophagitis: Secondary | ICD-10-CM | POA: Diagnosis not present

## 2021-06-04 DIAGNOSIS — J3503 Chronic tonsillitis and adenoiditis: Secondary | ICD-10-CM | POA: Diagnosis not present

## 2021-08-15 DIAGNOSIS — Z0000 Encounter for general adult medical examination without abnormal findings: Secondary | ICD-10-CM | POA: Diagnosis not present

## 2021-08-22 DIAGNOSIS — C8199 Hodgkin lymphoma, unspecified, extranodal and solid organ sites: Secondary | ICD-10-CM | POA: Diagnosis not present

## 2021-08-22 DIAGNOSIS — Z1331 Encounter for screening for depression: Secondary | ICD-10-CM | POA: Diagnosis not present

## 2021-08-22 DIAGNOSIS — Z23 Encounter for immunization: Secondary | ICD-10-CM | POA: Diagnosis not present

## 2021-08-22 DIAGNOSIS — K7689 Other specified diseases of liver: Secondary | ICD-10-CM | POA: Diagnosis not present

## 2021-08-22 DIAGNOSIS — Z1339 Encounter for screening examination for other mental health and behavioral disorders: Secondary | ICD-10-CM | POA: Diagnosis not present

## 2021-08-22 DIAGNOSIS — Z0000 Encounter for general adult medical examination without abnormal findings: Secondary | ICD-10-CM | POA: Diagnosis not present

## 2021-08-22 DIAGNOSIS — R202 Paresthesia of skin: Secondary | ICD-10-CM | POA: Diagnosis not present

## 2021-08-22 DIAGNOSIS — E538 Deficiency of other specified B group vitamins: Secondary | ICD-10-CM | POA: Diagnosis not present

## 2021-10-09 DIAGNOSIS — N943 Premenstrual tension syndrome: Secondary | ICD-10-CM | POA: Diagnosis not present

## 2021-10-09 DIAGNOSIS — N9089 Other specified noninflammatory disorders of vulva and perineum: Secondary | ICD-10-CM | POA: Diagnosis not present

## 2021-10-09 DIAGNOSIS — B081 Molluscum contagiosum: Secondary | ICD-10-CM | POA: Diagnosis not present

## 2021-10-15 DIAGNOSIS — M549 Dorsalgia, unspecified: Secondary | ICD-10-CM | POA: Diagnosis not present

## 2021-10-15 DIAGNOSIS — M9903 Segmental and somatic dysfunction of lumbar region: Secondary | ICD-10-CM | POA: Diagnosis not present

## 2021-10-30 DIAGNOSIS — B081 Molluscum contagiosum: Secondary | ICD-10-CM | POA: Diagnosis not present

## 2021-10-30 DIAGNOSIS — D225 Melanocytic nevi of trunk: Secondary | ICD-10-CM | POA: Diagnosis not present

## 2021-10-30 DIAGNOSIS — Z789 Other specified health status: Secondary | ICD-10-CM | POA: Diagnosis not present

## 2021-10-30 DIAGNOSIS — L538 Other specified erythematous conditions: Secondary | ICD-10-CM | POA: Diagnosis not present

## 2021-11-27 DIAGNOSIS — F331 Major depressive disorder, recurrent, moderate: Secondary | ICD-10-CM | POA: Diagnosis not present

## 2021-11-27 DIAGNOSIS — F4312 Post-traumatic stress disorder, chronic: Secondary | ICD-10-CM | POA: Diagnosis not present

## 2021-12-06 DIAGNOSIS — F4312 Post-traumatic stress disorder, chronic: Secondary | ICD-10-CM | POA: Diagnosis not present

## 2021-12-06 DIAGNOSIS — F331 Major depressive disorder, recurrent, moderate: Secondary | ICD-10-CM | POA: Diagnosis not present

## 2021-12-10 DIAGNOSIS — Z20822 Contact with and (suspected) exposure to covid-19: Secondary | ICD-10-CM | POA: Diagnosis not present

## 2022-01-22 DIAGNOSIS — J019 Acute sinusitis, unspecified: Secondary | ICD-10-CM | POA: Diagnosis not present

## 2022-01-26 DIAGNOSIS — F4312 Post-traumatic stress disorder, chronic: Secondary | ICD-10-CM | POA: Diagnosis not present

## 2022-01-26 DIAGNOSIS — F331 Major depressive disorder, recurrent, moderate: Secondary | ICD-10-CM | POA: Diagnosis not present

## 2022-02-02 DIAGNOSIS — F4312 Post-traumatic stress disorder, chronic: Secondary | ICD-10-CM | POA: Diagnosis not present

## 2022-02-02 DIAGNOSIS — F331 Major depressive disorder, recurrent, moderate: Secondary | ICD-10-CM | POA: Diagnosis not present

## 2022-02-05 DIAGNOSIS — N898 Other specified noninflammatory disorders of vagina: Secondary | ICD-10-CM | POA: Diagnosis not present

## 2022-02-09 DIAGNOSIS — F331 Major depressive disorder, recurrent, moderate: Secondary | ICD-10-CM | POA: Diagnosis not present

## 2022-02-09 DIAGNOSIS — F4312 Post-traumatic stress disorder, chronic: Secondary | ICD-10-CM | POA: Diagnosis not present

## 2022-02-13 DIAGNOSIS — Z113 Encounter for screening for infections with a predominantly sexual mode of transmission: Secondary | ICD-10-CM | POA: Diagnosis not present

## 2022-02-13 DIAGNOSIS — Z0189 Encounter for other specified special examinations: Secondary | ICD-10-CM | POA: Diagnosis not present

## 2022-02-23 DIAGNOSIS — F331 Major depressive disorder, recurrent, moderate: Secondary | ICD-10-CM | POA: Diagnosis not present

## 2022-02-23 DIAGNOSIS — F4312 Post-traumatic stress disorder, chronic: Secondary | ICD-10-CM | POA: Diagnosis not present

## 2022-03-02 DIAGNOSIS — F331 Major depressive disorder, recurrent, moderate: Secondary | ICD-10-CM | POA: Diagnosis not present

## 2022-03-02 DIAGNOSIS — F4312 Post-traumatic stress disorder, chronic: Secondary | ICD-10-CM | POA: Diagnosis not present

## 2022-03-09 DIAGNOSIS — F331 Major depressive disorder, recurrent, moderate: Secondary | ICD-10-CM | POA: Diagnosis not present

## 2022-03-09 DIAGNOSIS — F4312 Post-traumatic stress disorder, chronic: Secondary | ICD-10-CM | POA: Diagnosis not present

## 2022-03-17 DIAGNOSIS — F331 Major depressive disorder, recurrent, moderate: Secondary | ICD-10-CM | POA: Diagnosis not present

## 2022-03-17 DIAGNOSIS — F4312 Post-traumatic stress disorder, chronic: Secondary | ICD-10-CM | POA: Diagnosis not present

## 2022-04-01 DIAGNOSIS — F4312 Post-traumatic stress disorder, chronic: Secondary | ICD-10-CM | POA: Diagnosis not present

## 2022-04-01 DIAGNOSIS — F331 Major depressive disorder, recurrent, moderate: Secondary | ICD-10-CM | POA: Diagnosis not present

## 2022-04-07 DIAGNOSIS — F331 Major depressive disorder, recurrent, moderate: Secondary | ICD-10-CM | POA: Diagnosis not present

## 2022-04-07 DIAGNOSIS — F4312 Post-traumatic stress disorder, chronic: Secondary | ICD-10-CM | POA: Diagnosis not present

## 2022-04-14 DIAGNOSIS — F331 Major depressive disorder, recurrent, moderate: Secondary | ICD-10-CM | POA: Diagnosis not present

## 2022-04-14 DIAGNOSIS — F4312 Post-traumatic stress disorder, chronic: Secondary | ICD-10-CM | POA: Diagnosis not present

## 2022-04-21 DIAGNOSIS — F331 Major depressive disorder, recurrent, moderate: Secondary | ICD-10-CM | POA: Diagnosis not present

## 2022-04-21 DIAGNOSIS — F4312 Post-traumatic stress disorder, chronic: Secondary | ICD-10-CM | POA: Diagnosis not present

## 2022-05-05 DIAGNOSIS — F331 Major depressive disorder, recurrent, moderate: Secondary | ICD-10-CM | POA: Diagnosis not present

## 2022-05-05 DIAGNOSIS — F4312 Post-traumatic stress disorder, chronic: Secondary | ICD-10-CM | POA: Diagnosis not present

## 2022-05-12 DIAGNOSIS — F331 Major depressive disorder, recurrent, moderate: Secondary | ICD-10-CM | POA: Diagnosis not present

## 2022-05-12 DIAGNOSIS — F4312 Post-traumatic stress disorder, chronic: Secondary | ICD-10-CM | POA: Diagnosis not present

## 2022-05-19 DIAGNOSIS — F331 Major depressive disorder, recurrent, moderate: Secondary | ICD-10-CM | POA: Diagnosis not present

## 2022-05-19 DIAGNOSIS — F4312 Post-traumatic stress disorder, chronic: Secondary | ICD-10-CM | POA: Diagnosis not present

## 2022-05-26 DIAGNOSIS — F4312 Post-traumatic stress disorder, chronic: Secondary | ICD-10-CM | POA: Diagnosis not present

## 2022-05-26 DIAGNOSIS — F331 Major depressive disorder, recurrent, moderate: Secondary | ICD-10-CM | POA: Diagnosis not present

## 2022-06-03 DIAGNOSIS — F4312 Post-traumatic stress disorder, chronic: Secondary | ICD-10-CM | POA: Diagnosis not present

## 2022-06-03 DIAGNOSIS — F331 Major depressive disorder, recurrent, moderate: Secondary | ICD-10-CM | POA: Diagnosis not present

## 2022-06-04 DIAGNOSIS — R238 Other skin changes: Secondary | ICD-10-CM | POA: Diagnosis not present

## 2022-06-04 DIAGNOSIS — L858 Other specified epidermal thickening: Secondary | ICD-10-CM | POA: Diagnosis not present

## 2022-06-04 DIAGNOSIS — Z789 Other specified health status: Secondary | ICD-10-CM | POA: Diagnosis not present

## 2022-06-04 DIAGNOSIS — B081 Molluscum contagiosum: Secondary | ICD-10-CM | POA: Diagnosis not present

## 2022-06-04 DIAGNOSIS — L538 Other specified erythematous conditions: Secondary | ICD-10-CM | POA: Diagnosis not present

## 2022-06-05 DIAGNOSIS — Z01419 Encounter for gynecological examination (general) (routine) without abnormal findings: Secondary | ICD-10-CM | POA: Diagnosis not present

## 2022-06-05 DIAGNOSIS — Z113 Encounter for screening for infections with a predominantly sexual mode of transmission: Secondary | ICD-10-CM | POA: Diagnosis not present

## 2022-06-05 DIAGNOSIS — N926 Irregular menstruation, unspecified: Secondary | ICD-10-CM | POA: Diagnosis not present

## 2022-06-09 DIAGNOSIS — F4312 Post-traumatic stress disorder, chronic: Secondary | ICD-10-CM | POA: Diagnosis not present

## 2022-06-09 DIAGNOSIS — F331 Major depressive disorder, recurrent, moderate: Secondary | ICD-10-CM | POA: Diagnosis not present

## 2022-06-16 DIAGNOSIS — F4312 Post-traumatic stress disorder, chronic: Secondary | ICD-10-CM | POA: Diagnosis not present

## 2022-06-16 DIAGNOSIS — F331 Major depressive disorder, recurrent, moderate: Secondary | ICD-10-CM | POA: Diagnosis not present

## 2022-06-23 DIAGNOSIS — C8199 Hodgkin lymphoma, unspecified, extranodal and solid organ sites: Secondary | ICD-10-CM | POA: Diagnosis not present

## 2022-06-23 DIAGNOSIS — R110 Nausea: Secondary | ICD-10-CM | POA: Diagnosis not present

## 2022-06-23 DIAGNOSIS — Z23 Encounter for immunization: Secondary | ICD-10-CM | POA: Diagnosis not present

## 2022-06-30 DIAGNOSIS — F331 Major depressive disorder, recurrent, moderate: Secondary | ICD-10-CM | POA: Diagnosis not present

## 2022-06-30 DIAGNOSIS — F4312 Post-traumatic stress disorder, chronic: Secondary | ICD-10-CM | POA: Diagnosis not present

## 2022-07-07 DIAGNOSIS — F331 Major depressive disorder, recurrent, moderate: Secondary | ICD-10-CM | POA: Diagnosis not present

## 2022-07-07 DIAGNOSIS — F4312 Post-traumatic stress disorder, chronic: Secondary | ICD-10-CM | POA: Diagnosis not present

## 2022-07-10 DIAGNOSIS — L538 Other specified erythematous conditions: Secondary | ICD-10-CM | POA: Diagnosis not present

## 2022-07-10 DIAGNOSIS — R238 Other skin changes: Secondary | ICD-10-CM | POA: Diagnosis not present

## 2022-07-10 DIAGNOSIS — L298 Other pruritus: Secondary | ICD-10-CM | POA: Diagnosis not present

## 2022-07-10 DIAGNOSIS — B081 Molluscum contagiosum: Secondary | ICD-10-CM | POA: Diagnosis not present

## 2022-07-28 DIAGNOSIS — F4312 Post-traumatic stress disorder, chronic: Secondary | ICD-10-CM | POA: Diagnosis not present

## 2022-07-28 DIAGNOSIS — F331 Major depressive disorder, recurrent, moderate: Secondary | ICD-10-CM | POA: Diagnosis not present

## 2022-08-04 DIAGNOSIS — F331 Major depressive disorder, recurrent, moderate: Secondary | ICD-10-CM | POA: Diagnosis not present

## 2022-08-04 DIAGNOSIS — F4312 Post-traumatic stress disorder, chronic: Secondary | ICD-10-CM | POA: Diagnosis not present

## 2022-08-11 DIAGNOSIS — F4312 Post-traumatic stress disorder, chronic: Secondary | ICD-10-CM | POA: Diagnosis not present

## 2022-08-11 DIAGNOSIS — F331 Major depressive disorder, recurrent, moderate: Secondary | ICD-10-CM | POA: Diagnosis not present

## 2022-08-18 DIAGNOSIS — F4312 Post-traumatic stress disorder, chronic: Secondary | ICD-10-CM | POA: Diagnosis not present

## 2022-08-18 DIAGNOSIS — F331 Major depressive disorder, recurrent, moderate: Secondary | ICD-10-CM | POA: Diagnosis not present

## 2022-08-25 DIAGNOSIS — F4312 Post-traumatic stress disorder, chronic: Secondary | ICD-10-CM | POA: Diagnosis not present

## 2022-08-25 DIAGNOSIS — F331 Major depressive disorder, recurrent, moderate: Secondary | ICD-10-CM | POA: Diagnosis not present

## 2022-08-28 DIAGNOSIS — Z0000 Encounter for general adult medical examination without abnormal findings: Secondary | ICD-10-CM | POA: Diagnosis not present

## 2022-08-28 DIAGNOSIS — R7989 Other specified abnormal findings of blood chemistry: Secondary | ICD-10-CM | POA: Diagnosis not present

## 2022-09-02 DIAGNOSIS — F331 Major depressive disorder, recurrent, moderate: Secondary | ICD-10-CM | POA: Diagnosis not present

## 2022-09-02 DIAGNOSIS — F4312 Post-traumatic stress disorder, chronic: Secondary | ICD-10-CM | POA: Diagnosis not present

## 2022-09-04 DIAGNOSIS — Z1389 Encounter for screening for other disorder: Secondary | ICD-10-CM | POA: Diagnosis not present

## 2022-09-04 DIAGNOSIS — J45909 Unspecified asthma, uncomplicated: Secondary | ICD-10-CM | POA: Diagnosis not present

## 2022-09-04 DIAGNOSIS — Z0000 Encounter for general adult medical examination without abnormal findings: Secondary | ICD-10-CM | POA: Diagnosis not present

## 2022-09-04 DIAGNOSIS — Z1331 Encounter for screening for depression: Secondary | ICD-10-CM | POA: Diagnosis not present

## 2022-09-23 DIAGNOSIS — F331 Major depressive disorder, recurrent, moderate: Secondary | ICD-10-CM | POA: Diagnosis not present

## 2022-09-23 DIAGNOSIS — F4312 Post-traumatic stress disorder, chronic: Secondary | ICD-10-CM | POA: Diagnosis not present

## 2022-09-29 DIAGNOSIS — F4312 Post-traumatic stress disorder, chronic: Secondary | ICD-10-CM | POA: Diagnosis not present

## 2022-09-29 DIAGNOSIS — F331 Major depressive disorder, recurrent, moderate: Secondary | ICD-10-CM | POA: Diagnosis not present

## 2022-10-06 ENCOUNTER — Ambulatory Visit (INDEPENDENT_AMBULATORY_CARE_PROVIDER_SITE_OTHER): Payer: BC Managed Care – PPO | Admitting: Psychiatry

## 2022-10-06 ENCOUNTER — Encounter: Payer: Self-pay | Admitting: Psychiatry

## 2022-10-06 VITALS — BP 132/88 | HR 73 | Ht 61.0 in | Wt 180.0 lb

## 2022-10-06 DIAGNOSIS — F331 Major depressive disorder, recurrent, moderate: Secondary | ICD-10-CM | POA: Diagnosis not present

## 2022-10-06 DIAGNOSIS — F4310 Post-traumatic stress disorder, unspecified: Secondary | ICD-10-CM | POA: Diagnosis not present

## 2022-10-06 DIAGNOSIS — F902 Attention-deficit hyperactivity disorder, combined type: Secondary | ICD-10-CM | POA: Diagnosis not present

## 2022-10-06 DIAGNOSIS — F411 Generalized anxiety disorder: Secondary | ICD-10-CM | POA: Diagnosis not present

## 2022-10-06 DIAGNOSIS — F4312 Post-traumatic stress disorder, chronic: Secondary | ICD-10-CM | POA: Diagnosis not present

## 2022-10-06 MED ORDER — LISDEXAMFETAMINE DIMESYLATE 20 MG PO CAPS: 20.0000 mg | ORAL_CAPSULE | Freq: Every day | ORAL | 0 refills | Status: DC

## 2022-10-06 NOTE — Progress Notes (Signed)
[facility] [address], [place]   New patient visit Date of Service: 10/06/2022  Referral Source: self History From: patient, chart review   New Patient Appointment     Mallory Price is a 32 y.o. female with a history significant for PTSD, GAD, ADHD. Patient is currently taking the following medications:  - none _______________________________________________________________  Mallory Price presents to clinic on her own for her appointment.   She was referred to our clinic due to concerns about her attention and it's impact on her daily function. Mallory Price reports trouble with focus, task completion and other symptoms that date back to her childhood. She states that she was never evaluated for ADHD due to her family not having mental health knowledge at the time. She reports that she often struggles to complete tasks at work, struggles with organizing tasks, struggles with remembering obligations, struggles with delaying tasks that require high effort, making careless errors on tasks, losing focus on mundane tasks, struggling to focus on people talking directly to her, being easily distracted, talking excessively in social situations, talking over people in conversations, struggling to wait her turn, and interrupting others when they are doing something. She has a new job, which requires increased focus and attention, so these symptoms are becoming more apparent. Her therapist, who has seen her for years, feels that she has ADHD. Reviewed the potential treatment options for ADHD, including stimulants and non-stimulants. She is open to trying a stimulant for her focus. She has never tried a medicine before for this condition.  Mallory Price reports a history of trauma that dates back to childhood. She reports some rough periods during her childhood, including a sister that was abusive and a mother that is narcissistic. In 2016, she was in a toxic relationship and was diagnosed  with Hodgkins lymphoma. This was an extremely difficult time for her. She would spend most of the days crying or angry, and struggled greatly with emotional regulation. She received 4 months of chemotherapy at that time, shaved her head, and has been in remission for years. She does feel that this time period had a lasting impact on her, and was diagnosed with PTSD by her therapist. Currently she reports some nightmares to her boyfriend at the time. She feels that she still has some hypervigilance about cancer and illnesses. She feels she still overreacts to small physical symptoms, worrying it may be cancer. She washes her hands constantly, due to worries about infection related to cancer and COVID. She avoids certain hobbies and behaviors that she previously enjoyed. She stays out of the sun mostly, due to concern about getting skin cancer. She feels that overall her mood is actually better now than it was during that time. She feels that she has a second chance at life, and feels joy and happiness.  Mallory Price reports a history of anxiety, dating back years as well. Previously she would worry about "everything". This included future events, past events, over thinking things going on in her life. She worried about her job, money, her family, her health. She was unable to control this worry, and was often on edge, irritable, and stressed. With therapy this has improved tremendously, and she currently feels that she is able to control her anxiety fairly well.  She never took a medicine solely for anxiety.  She was previously diagnosed with PMDD, took fluoxetine on the week of her menstrual cycle. This increased anxiety but helped her SI she would have during these days. Currently  she has a menstrual cycle about once per three months, but denies significant mood changes around these times.  No SI/HI/AVH.    Current suicidal/homicidal ideations: denied Current auditory/visual hallucinations: denied Sleep:  stable Appetite: Stable Depression: reduced concentration Bipolar symptoms: denies ASD: denies Encopresis/Enuresis: denies Tic: denies Generalized Anxiety Disorder: see HPI Other anxiety: denies Obsessions and Compulsions: denies Trauma/Abuse: see HPI ADHD: see HPI  Review of Systems  Constitutional:  Positive for unexpected weight change.  All other systems reviewed and are negative.      Current Outpatient Medications:    lisdexamfetamine (VYVANSE) 20 MG capsule, Take 1 capsule (20 mg total) by mouth daily., Disp: 30 capsule, Rfl: 0   Biotin w/ Vitamins C & E (HAIR/SKIN/NAILS PO), Take by mouth daily., Disp: , Rfl:    cholecalciferol (VITAMIN D) 1000 units tablet, Take 1,000 Units by mouth daily., Disp: , Rfl:    levonorgestrel-ethinyl estradiol (LYBREL) 90-20 MCG tablet, Take 1 tablet by mouth daily., Disp: , Rfl:    vitamin B-12 (CYANOCOBALAMIN) 1000 MCG tablet, Take 1,000 mcg by mouth daily., Disp: , Rfl:    Allergies  Allergen Reactions   No Known Allergies       Psychiatric History: Previous diagnoses/symptoms: PTSD, GAD, ADHD Non-Suicidal Self-Injury: denies Suicide Attempt History: denies Violence History: denies  Current psychiatric provider: denies Psychotherapy: Junious Dresser Previous psychiatric medication trials:  fluoxetine for PMDD Psychiatric hospitalizations: denies History of trauma/abuse: Cancer dx in 2016 - Hodgkins lymphoma. Stressful home life with sister and parents    Past Medical History:  Diagnosis Date   Anxiety    h/o some anxiety, resolved with therapy, no meds   Asthma    cough worsened in june 2016   Cancer [facility]) 02/03/15   lymphoma   Dysuria 09/18/2015   Hodgkin lymphoma ([place]) 02/20/2015   Insomnia 03/29/2015   Nausea without vomiting 02/20/2015    History of head trauma?  Concussions from Tae-Kwon-Do History of seizures?  No     Substance use reviewed with pt, with pertinent items below: Alcohol - drinks on weekends, 4-5  drinks per night  History of substance/alcohol abuse treatment: denies     Family psychiatric history: anxiety and depression in mother. Narcissistic mom, grandfather per her report  Family history of suicide? 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  Pulmonary:     Effort: Pulmonary effort is normal.  Neurological:     General: No focal deficit present.     Mental Status: She is alert.     Review of Systems  Constitutional:  Positive for unexpected weight change.  All other systems reviewed and are negative.   Blood pressure 132/88, pulse 73, height '5\' 1"'$  (1.549 m), weight 180 lb (81.6 kg).Body mass index is 34.01 kg/m.  General Appearance: Neat and Well Groomed  Eye Contact:  Good  Speech:  Clear and Coherent and Normal Rate  Mood:  Euthymic  Affect:  Appropriate and Congruent  Thought Process:  Coherent and Goal Directed  Orientation:  Full (Time, Place, and Person)  Thought Content:  Logical  Suicidal  Thoughts:  No  Homicidal Thoughts:  No  Memory:  Immediate;   Good  Judgement:  Good  Insight:  Good  Psychomotor Activity:  Normal  Concentration:  Concentration: Good  Recall:  Good  Fund of Knowledge:  Good  Language:  Good  Cognition:  WNL     Assessment   Psychiatric Diagnoses:   ICD-10-CM   1. PTSD (post-traumatic stress disorder)  F43.10     2. Generalized anxiety disorder  F41.1     3. 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 female with a history detailed above.   On evaluation Reagyn has symptoms consistent with GAD, PTSD, and ADHD. Her PTSD stems from a cancer diagnosis that began in 2016, requiring her to go through 4 months of chemotherapy. She is now in remission, but has some lingering PTSD symptoms, though these have improved dramatically with time. Her current symptoms include nightmares, some mild avoidance behaviors (avoids sun due to cancer risk), hypervigilance about her physical well-being, washing hands excessively, etc. Her mood appears to be doing well, with no long lasting impact from her trauma. She has been in therapy for years, which has helped tremendously.  Lolamae reports a history of anxiety, including some generalized symptoms and social anxiety. She would worry about everything, from the future to past. She would over think things, worry about upcoming events, get extremely stressed by responsibility and relationships, including family. This previously caused her to be on edge, irritable, and struggle some with sleep. She now feels that she can control her anxiety and is able to use coping skills to manage these symptoms.  She does report ADHD symptoms. She struggles with organization, task completion, being forgetful, making careless errors, getting distracted, focus, fidgeting, leaving her seat, talking over people, not waiting her turn, talking excessively. She has had these  symptoms from early childhood, however her family never had her tested. She has not tried medicine for ADHD as of yet. These symptoms currently impact her function in her social life and at her job. Given her symptoms we will plan on trying a stimulant in Vyvanse - if this is unavailable we can try Adderall XR. She denies any SI/HI/AVH.  There are no identified acute safety concerns. Continue outpatient level of care.     Plan  Medication management:  - Start Vyvanse '20mg'$  daily for ADHD  Labs/Studies:  - ADHD adult rating scale: 14 of 18 symptoms positive - consistent with combined type ADHD  Additional recommendations:  - Continue with current therapist, Crisis plan reviewed and patient verbally contracts for safety. Go to ED with emergent symptoms or safety concerns, and Risks, benefits, side effects of medications, including any / all black box warnings, discussed with patient, who verbalizes their understanding   Follow Up: Return in 1 month - Call in the interim for any side-effects, decompensation, questions, or problems between now and the next visit.  I have spend 80 minutes reviewing the patients chart, meeting with the patient and family, and reviewing medications and potential side effects for their condition of ADHD, PTSD, GAD.  Acquanetta Belling, MD [facility]

## 2022-10-09 ENCOUNTER — Telehealth: Payer: Self-pay | Admitting: Psychiatry

## 2022-10-09 ENCOUNTER — Other Ambulatory Visit: Payer: Self-pay

## 2022-10-09 MED ORDER — LISDEXAMFETAMINE DIMESYLATE 20 MG PO CAPS: 20.0000 mg | ORAL_CAPSULE | Freq: Every day | ORAL | 0 refills | Status: DC

## 2022-10-09 NOTE — Telephone Encounter (Signed)
Pended Rx to [facility]. [facility].

## 2022-10-09 NOTE — Telephone Encounter (Signed)
Pt reporting [facility] [place] has Brand Vyvance 20 mg has in stock. Send Rx please

## 2022-10-09 NOTE — Telephone Encounter (Signed)
Mallory Price called at 12:00 to report that [facility] [address] [facility] NOT [facility].  She asked for it to be transferred to the [facility] on [address], [name].  They do have it.

## 2022-10-09 NOTE — Telephone Encounter (Signed)
Pended.

## 2022-10-09 NOTE — Telephone Encounter (Signed)
Checked with current pharmacy and brand will require a PA.  Patient notified. She will call around and let us know if she wants Rx sent to a different pharmacy.

## 2022-10-13 ENCOUNTER — Telehealth: Payer: Self-pay | Admitting: Psychiatry

## 2022-10-13 ENCOUNTER — Other Ambulatory Visit: Payer: Self-pay

## 2022-10-13 MED ORDER — AMPHETAMINE-DEXTROAMPHET ER 10 MG PO CP24: 10.0000 mg | ORAL_CAPSULE | Freq: Every day | ORAL | 0 refills | Status: DC

## 2022-10-13 NOTE — Telephone Encounter (Signed)
Pended.

## 2022-10-13 NOTE — Telephone Encounter (Signed)
Pt lives in [place] and needs this to be sent to another pharmacy.She will check to see if they have it then call back with pharmacy info

## 2022-10-13 NOTE — Telephone Encounter (Signed)
Please review

## 2022-10-13 NOTE — Telephone Encounter (Signed)
Pt called at 2:57p.  She located Adderall at [facility] in [place].  It is available there.  Next appt 2/9

## 2022-10-13 NOTE — Telephone Encounter (Signed)
I've sent Adderall XR '10mg'$  to her [name]

## 2022-10-13 NOTE — Telephone Encounter (Signed)
Mallory Price called at 11:54 to report that she has not been able to find the vyvanse anywhere. Pharmacies have been able to find it for her either.  She would like for you to consider another medication that we will act the same and last the same as the vyvanse.  Please let her know your recommendations.

## 2022-10-14 MED ORDER — AMPHETAMINE-DEXTROAMPHET ER 10 MG PO CP24: 10.0000 mg | ORAL_CAPSULE | Freq: Every day | ORAL | 0 refills | Status: DC

## 2022-10-20 DIAGNOSIS — F331 Major depressive disorder, recurrent, moderate: Secondary | ICD-10-CM | POA: Diagnosis not present

## 2022-10-20 DIAGNOSIS — F4312 Post-traumatic stress disorder, chronic: Secondary | ICD-10-CM | POA: Diagnosis not present

## 2022-10-30 ENCOUNTER — Telehealth (INDEPENDENT_AMBULATORY_CARE_PROVIDER_SITE_OTHER): Payer: BC Managed Care – PPO | Admitting: Psychiatry

## 2022-10-30 ENCOUNTER — Encounter: Payer: Self-pay | Admitting: Psychiatry

## 2022-10-30 DIAGNOSIS — F4310 Post-traumatic stress disorder, unspecified: Secondary | ICD-10-CM | POA: Diagnosis not present

## 2022-10-30 DIAGNOSIS — F411 Generalized anxiety disorder: Secondary | ICD-10-CM | POA: Diagnosis not present

## 2022-10-30 DIAGNOSIS — F902 Attention-deficit hyperactivity disorder, combined type: Secondary | ICD-10-CM | POA: Diagnosis not present

## 2022-10-30 MED ORDER — AMPHETAMINE-DEXTROAMPHET ER 15 MG PO CP24: 15.0000 mg | ORAL_CAPSULE | Freq: Every day | ORAL | 0 refills | Status: DC

## 2022-10-30 MED ORDER — AMPHETAMINE-DEXTROAMPHETAMINE 5 MG PO TABS: ORAL_TABLET | ORAL | 0 refills | Status: DC

## 2022-10-30 NOTE — Progress Notes (Signed)
[id number], [place]   Follow-up visit  Date of Service: 10/30/2022  CC/Purpose: Routine medication management follow up.   I connected with pt at 2:10PM on 10/30/2022  by a video enabled telemedicine application. I verified I was speaking with the correct person using two identifiers.   I discussed the limitations of evaluation and management by telemedicine and the availability of in person appointments. The patient expressed understanding and agreed to proceed.   I discussed the assessment and treatment plan with the patient. The patient was provided an opportunity to ask questions and all were answered. The patient agreed with the plan and demonstrated an understanding of the instructions.   The patient was advised to call back or seek an in-person evaluation if the symptoms worsen or if the condition fails to improve as anticipated.   I provided 30 minutes of total time during this encounter.  The patient was located at home.  The provider was located at [place].   Mallory Price is a 32 y.o. female with a past psychiatric history of ADHD who presents today for a psychiatric follow up appointment.    The patient was last seen on 10/06/22, at which time the following plan was established:  Medication management:             - Start Vyvanse 31m daily for ADHD __________________________________________________________________________ Acute events/encounters since last visit: none    Mallory Price via video for her appointment. On evaluation she notes that she has been doing okay since the last visit. There was some difficulty getting Vyvanse so we started Adderall. She has taken this for about 2 weeks. She doesn't take it on weekends. So far she has noticed more benefit at the beginning of the week. She notices more organization, able to complete her tasks, feels better about her work and her ability to complete  it. She notices that around 3PM she will start to get more scattered and it's difficult to complete tasks. She works until around [employer] this is a bit of an issue. She denies any major side effects to the medicine. No SI/HI/AVH.     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, manufacturing systemsassistance for patients   Living Situation: lives with mom, [place] [facility]     Allergies  Allergen Reactions   No Known Allergies       Labs:  reviewed  Medical diagnoses: Patient Active Problem List   Diagnosis Date Noted   PTSD (post-traumatic stress disorder) 10/06/2022   Generalized anxiety disorder 10/06/2022   Attention deficit hyperactivity disorder (ADHD), combined type 10/06/2022   Irregular periods 10/07/2016   Chronic jaw pain 02/03/2016   Nodular sclerosis [place]    Postoperative seroma 07/30/2015   Chest wall tenderness 02/27/2015   History of Hodgkin's lymphoma 02/20/2015    Psychiatric Specialty Exam: Review of Systems  All other systems reviewed and are negative.   There were no vitals taken for this visit.There is no height or weight on file to calculate BMI.  General Appearance: Neat and Well Groomed  Eye Contact:  Good  Speech:  Clear and Coherent  Mood:  Euthymic  Affect:  Appropriate  Thought Process:  Goal Directed  Orientation:  Full (Time, Place, and Person)  Thought Content:  Logical  Suicidal Thoughts:  No  Homicidal Thoughts:  No  Memory:  Immediate;   Good  Judgement:  Good  Insight:  Good  Psychomotor Activity:  Normal  Concentration:  Concentration: Good  Recall:  Good  Fund  of Knowledge:  Good  Language:  Good  Assets:  Communication Skills Desire for Improvement Financial Resources/Insurance Housing Leisure Time Physical Health Resilience Social Support Talents/Skills Transportation Vocational/Educational  Cognition:  WNL      Assessment   Psychiatric Diagnoses:   ICD-10-CM   1. Attention deficit hyperactivity disorder (ADHD), combined type  F90.2     2. PTSD (post-traumatic stress disorder)  F43.10     3. Generalized anxiety disorder  F41.1       Patient complexity: Moderate   Patient Education and Counseling:  Supportive therapy provided for identified psychosocial stressors.  Medication education provided and decisions regarding medication regimen discussed with patient/guardian.   On assessment today, Mallory Price has been doing well. It took time to get a stimulant due to the shortage at pharmacies, but since starting she has noticed some benefit. It seems to lose some effect during the week and wears off around 3PM everyday. We will try a higher dose of the adderall and add an afternoon dose to help last through her workday. No major issues with anxiety or trauma. In therapy. No SI/HI/AVH.    Plan  Medication management:  - Increase Adderall XR to 63m daily for ADHD  - Start Adderall IR 541mdaily at 2:30 PM for ADHD  Labs/Studies:   - ADHD adult rating scale: 14 of 18 symptoms positive - consistent with combined type ADHD   Additional recommendations:  - Continue with current therapist, Crisis plan reviewed and patient verbally contracts for safety. Go to ED with emergent symptoms or safety concerns, and Risks, benefits, side effects of medications, including any / all black box warnings, discussed with patient, who verbalizes their understanding   Follow Up: Return in 1 month - Call in the interim for any side-effects, decompensation, questions, or problems between now and the next visit.   I have spent 30 minutes reviewing the  patients chart, meeting with the patient and family, and reviewing medicines and side effects.   Mallory BellingMD [facility]

## 2022-11-03 DIAGNOSIS — F331 Major depressive disorder, recurrent, moderate: Secondary | ICD-10-CM | POA: Diagnosis not present

## 2022-11-03 DIAGNOSIS — F4312 Post-traumatic stress disorder, chronic: Secondary | ICD-10-CM | POA: Diagnosis not present

## 2022-11-10 DIAGNOSIS — F331 Major depressive disorder, recurrent, moderate: Secondary | ICD-10-CM | POA: Diagnosis not present

## 2022-11-10 DIAGNOSIS — F4312 Post-traumatic stress disorder, chronic: Secondary | ICD-10-CM | POA: Diagnosis not present

## 2022-11-17 DIAGNOSIS — F4312 Post-traumatic stress disorder, chronic: Secondary | ICD-10-CM | POA: Diagnosis not present

## 2022-11-17 DIAGNOSIS — F331 Major depressive disorder, recurrent, moderate: Secondary | ICD-10-CM | POA: Diagnosis not present

## 2022-11-24 DIAGNOSIS — F331 Major depressive disorder, recurrent, moderate: Secondary | ICD-10-CM | POA: Diagnosis not present

## 2022-11-24 DIAGNOSIS — F4312 Post-traumatic stress disorder, chronic: Secondary | ICD-10-CM | POA: Diagnosis not present

## 2022-12-01 DIAGNOSIS — F331 Major depressive disorder, recurrent, moderate: Secondary | ICD-10-CM | POA: Diagnosis not present

## 2022-12-01 DIAGNOSIS — F4312 Post-traumatic stress disorder, chronic: Secondary | ICD-10-CM | POA: Diagnosis not present

## 2022-12-04 ENCOUNTER — Telehealth (INDEPENDENT_AMBULATORY_CARE_PROVIDER_SITE_OTHER): Payer: BC Managed Care – PPO | Admitting: Psychiatry

## 2022-12-04 ENCOUNTER — Encounter: Payer: Self-pay | Admitting: Psychiatry

## 2022-12-04 DIAGNOSIS — F411 Generalized anxiety disorder: Secondary | ICD-10-CM | POA: Diagnosis not present

## 2022-12-04 DIAGNOSIS — F902 Attention-deficit hyperactivity disorder, combined type: Secondary | ICD-10-CM | POA: Diagnosis not present

## 2022-12-04 DIAGNOSIS — F4310 Post-traumatic stress disorder, unspecified: Secondary | ICD-10-CM | POA: Diagnosis not present

## 2022-12-04 MED ORDER — AMPHETAMINE-DEXTROAMPHET ER 20 MG PO CP24: 20.0000 mg | ORAL_CAPSULE | Freq: Every day | ORAL | 0 refills | Status: DC

## 2022-12-04 MED ORDER — AMPHETAMINE-DEXTROAMPHETAMINE 5 MG PO TABS: ORAL_TABLET | ORAL | 0 refills | Status: DC
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
## 2022-12-08 DIAGNOSIS — F4312 Post-traumatic stress disorder, chronic: Secondary | ICD-10-CM | POA: Diagnosis not present

## 2022-12-08 DIAGNOSIS — F331 Major depressive disorder, recurrent, moderate: Secondary | ICD-10-CM | POA: Diagnosis not present

## 2022-12-15 DIAGNOSIS — F4312 Post-traumatic stress disorder, chronic: Secondary | ICD-10-CM | POA: Diagnosis not present

## 2022-12-15 DIAGNOSIS — F331 Major depressive disorder, recurrent, moderate: Secondary | ICD-10-CM | POA: Diagnosis not present

## 2022-12-29 DIAGNOSIS — F4312 Post-traumatic stress disorder, chronic: Secondary | ICD-10-CM | POA: Diagnosis not present

## 2022-12-29 DIAGNOSIS — F331 Major depressive disorder, recurrent, moderate: Secondary | ICD-10-CM | POA: Diagnosis not present

## 2023-01-08 ENCOUNTER — Telehealth (INDEPENDENT_AMBULATORY_CARE_PROVIDER_SITE_OTHER): Payer: BC Managed Care – PPO | Admitting: Psychiatry

## 2023-01-08 ENCOUNTER — Encounter: Payer: Self-pay | Admitting: Psychiatry

## 2023-01-08 DIAGNOSIS — F4310 Post-traumatic stress disorder, unspecified: Secondary | ICD-10-CM

## 2023-01-08 DIAGNOSIS — F902 Attention-deficit hyperactivity disorder, combined type: Secondary | ICD-10-CM

## 2023-01-08 MED ORDER — AMPHETAMINE-DEXTROAMPHETAMINE 5 MG PO TABS: ORAL_TABLET | ORAL | 0 refills | Status: DC

## 2023-01-08 MED ORDER — AMPHETAMINE-DEXTROAMPHETAMINE 5 MG PO TABS: 5.0000 mg | ORAL_TABLET | Freq: Every day | ORAL | 0 refills | Status: DC

## 2023-01-08 MED ORDER — AMPHETAMINE-DEXTROAMPHET ER 20 MG PO CP24: 20.0000 mg | ORAL_CAPSULE | ORAL | 0 refills | Status: DC
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
## 2023-01-12 DIAGNOSIS — F331 Major depressive disorder, recurrent, moderate: Secondary | ICD-10-CM | POA: Diagnosis not present

## 2023-01-12 DIAGNOSIS — F4312 Post-traumatic stress disorder, chronic: Secondary | ICD-10-CM | POA: Diagnosis not present

## 2023-01-26 DIAGNOSIS — F331 Major depressive disorder, recurrent, moderate: Secondary | ICD-10-CM | POA: Diagnosis not present

## 2023-01-26 DIAGNOSIS — F4312 Post-traumatic stress disorder, chronic: Secondary | ICD-10-CM | POA: Diagnosis not present

## 2023-02-09 DIAGNOSIS — F331 Major depressive disorder, recurrent, moderate: Secondary | ICD-10-CM | POA: Diagnosis not present

## 2023-02-09 DIAGNOSIS — F4312 Post-traumatic stress disorder, chronic: Secondary | ICD-10-CM | POA: Diagnosis not present

## 2023-02-23 DIAGNOSIS — F4312 Post-traumatic stress disorder, chronic: Secondary | ICD-10-CM | POA: Diagnosis not present

## 2023-02-23 DIAGNOSIS — F331 Major depressive disorder, recurrent, moderate: Secondary | ICD-10-CM | POA: Diagnosis not present

## 2023-02-26 DIAGNOSIS — C8199 Hodgkin lymphoma, unspecified, extranodal and solid organ sites: Secondary | ICD-10-CM | POA: Diagnosis not present

## 2023-03-09 DIAGNOSIS — F4312 Post-traumatic stress disorder, chronic: Secondary | ICD-10-CM | POA: Diagnosis not present

## 2023-03-09 DIAGNOSIS — F331 Major depressive disorder, recurrent, moderate: Secondary | ICD-10-CM | POA: Diagnosis not present

## 2023-03-16 DIAGNOSIS — F4312 Post-traumatic stress disorder, chronic: Secondary | ICD-10-CM | POA: Diagnosis not present

## 2023-03-16 DIAGNOSIS — F331 Major depressive disorder, recurrent, moderate: Secondary | ICD-10-CM | POA: Diagnosis not present

## 2023-03-23 DIAGNOSIS — F331 Major depressive disorder, recurrent, moderate: Secondary | ICD-10-CM | POA: Diagnosis not present

## 2023-03-23 DIAGNOSIS — F4312 Post-traumatic stress disorder, chronic: Secondary | ICD-10-CM | POA: Diagnosis not present

## 2023-03-30 DIAGNOSIS — F331 Major depressive disorder, recurrent, moderate: Secondary | ICD-10-CM | POA: Diagnosis not present

## 2023-03-30 DIAGNOSIS — F4312 Post-traumatic stress disorder, chronic: Secondary | ICD-10-CM | POA: Diagnosis not present

## 2023-03-31 ENCOUNTER — Telehealth: Payer: Self-pay | Admitting: Psychiatry

## 2023-03-31 NOTE — Telephone Encounter (Signed)
Pt LVM @ 2:40p. She said she would like to "revisit" her meds.  She wants to decrease Adderall 20mg .  She said it is creating anxiety and some weight loss.  Pls call her to discuss reducing the dosage.  Next apt 7/19

## 2023-04-01 NOTE — Telephone Encounter (Signed)
Patient has a FU appt on 7/19. She said she has had weight loss, which is intentional, but that thinks with the weight loss the Adderall is hitting her harder. She was previously on 15 mg XR and said she had a few left and took those and said she felt better leveled off. She would like to continue the 5 mg for a boost.  She last filled both doses on 6/14.  She is aware that you are on vacation this week.

## 2023-04-05 MED ORDER — AMPHETAMINE-DEXTROAMPHET ER 15 MG PO CP24: 15.0000 mg | ORAL_CAPSULE | ORAL | 0 refills | Status: DC

## 2023-04-05 NOTE — Telephone Encounter (Signed)
Sent Adderall XR 15mg  to her pharmacy in [place]

## 2023-04-05 NOTE — Addendum Note (Signed)
Addended by: Job Founds on: 04/05/2023 09:13 AM   Modules accepted: Orders

## 2023-04-09 ENCOUNTER — Telehealth (INDEPENDENT_AMBULATORY_CARE_PROVIDER_SITE_OTHER): Payer: BC Managed Care – PPO | Admitting: Psychiatry

## 2023-04-09 DIAGNOSIS — F902 Attention-deficit hyperactivity disorder, combined type: Secondary | ICD-10-CM

## 2023-04-09 DIAGNOSIS — F411 Generalized anxiety disorder: Secondary | ICD-10-CM

## 2023-04-09 DIAGNOSIS — F4310 Post-traumatic stress disorder, unspecified: Secondary | ICD-10-CM

## 2023-04-11 ENCOUNTER — Encounter: Payer: Self-pay | Admitting: Psychiatry
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
## 2023-04-13 DIAGNOSIS — F331 Major depressive disorder, recurrent, moderate: Secondary | ICD-10-CM | POA: Diagnosis not present

## 2023-04-13 DIAGNOSIS — F4312 Post-traumatic stress disorder, chronic: Secondary | ICD-10-CM | POA: Diagnosis not present

## 2023-04-21 DIAGNOSIS — L298 Other pruritus: Secondary | ICD-10-CM | POA: Diagnosis not present

## 2023-04-21 DIAGNOSIS — B078 Other viral warts: Secondary | ICD-10-CM | POA: Diagnosis not present

## 2023-04-21 DIAGNOSIS — I788 Other diseases of capillaries: Secondary | ICD-10-CM | POA: Diagnosis not present

## 2023-04-21 DIAGNOSIS — L538 Other specified erythematous conditions: Secondary | ICD-10-CM | POA: Diagnosis not present

## 2023-04-21 DIAGNOSIS — L918 Other hypertrophic disorders of the skin: Secondary | ICD-10-CM | POA: Diagnosis not present

## 2023-04-21 DIAGNOSIS — L249 Irritant contact dermatitis, unspecified cause: Secondary | ICD-10-CM | POA: Diagnosis not present

## 2023-04-26 DIAGNOSIS — H9202 Otalgia, left ear: Secondary | ICD-10-CM | POA: Diagnosis not present

## 2023-05-05 DIAGNOSIS — F331 Major depressive disorder, recurrent, moderate: Secondary | ICD-10-CM | POA: Diagnosis not present

## 2023-05-05 DIAGNOSIS — F4312 Post-traumatic stress disorder, chronic: Secondary | ICD-10-CM | POA: Diagnosis not present

## 2023-05-20 DIAGNOSIS — F4312 Post-traumatic stress disorder, chronic: Secondary | ICD-10-CM | POA: Diagnosis not present

## 2023-05-20 DIAGNOSIS — F331 Major depressive disorder, recurrent, moderate: Secondary | ICD-10-CM | POA: Diagnosis not present

## 2023-05-27 ENCOUNTER — Other Ambulatory Visit: Payer: Self-pay

## 2023-05-27 ENCOUNTER — Telehealth: Payer: Self-pay | Admitting: Psychiatry

## 2023-05-27 NOTE — Telephone Encounter (Signed)
Previous scripts were filled in [place]. Patient is on 2 different doses. Called her to see if she wanted both refilled and she does. Pended.

## 2023-05-27 NOTE — Telephone Encounter (Signed)
Mallory Price lvm requesting her Adderall Rx sent to the [facility] on [address]. The last Rx was filled in [facility]  Contact # [phone]

## 2023-05-28 MED ORDER — AMPHETAMINE-DEXTROAMPHETAMINE 5 MG PO TABS: ORAL_TABLET | ORAL | 0 refills | Status: DC

## 2023-05-28 MED ORDER — AMPHETAMINE-DEXTROAMPHET ER 15 MG PO CP24: 15.0000 mg | ORAL_CAPSULE | ORAL | 0 refills | Status: DC

## 2023-06-02 DIAGNOSIS — F4312 Post-traumatic stress disorder, chronic: Secondary | ICD-10-CM | POA: Diagnosis not present

## 2023-06-02 DIAGNOSIS — F331 Major depressive disorder, recurrent, moderate: Secondary | ICD-10-CM | POA: Diagnosis not present

## 2023-06-11 DIAGNOSIS — J0300 Acute streptococcal tonsillitis, unspecified: Secondary | ICD-10-CM | POA: Diagnosis not present

## 2023-06-16 DIAGNOSIS — F4312 Post-traumatic stress disorder, chronic: Secondary | ICD-10-CM | POA: Diagnosis not present

## 2023-06-16 DIAGNOSIS — F331 Major depressive disorder, recurrent, moderate: Secondary | ICD-10-CM | POA: Diagnosis not present

## 2023-06-23 DIAGNOSIS — F4312 Post-traumatic stress disorder, chronic: Secondary | ICD-10-CM | POA: Diagnosis not present

## 2023-06-23 DIAGNOSIS — F331 Major depressive disorder, recurrent, moderate: Secondary | ICD-10-CM | POA: Diagnosis not present

## 2023-07-01 DIAGNOSIS — J039 Acute tonsillitis, unspecified: Secondary | ICD-10-CM | POA: Diagnosis not present

## 2023-07-01 DIAGNOSIS — R197 Diarrhea, unspecified: Secondary | ICD-10-CM | POA: Diagnosis not present

## 2023-07-06 DIAGNOSIS — Z8571 Personal history of Hodgkin lymphoma: Secondary | ICD-10-CM | POA: Diagnosis not present

## 2023-07-06 DIAGNOSIS — J0391 Acute recurrent tonsillitis, unspecified: Secondary | ICD-10-CM | POA: Diagnosis not present

## 2023-07-13 DIAGNOSIS — F331 Major depressive disorder, recurrent, moderate: Secondary | ICD-10-CM | POA: Diagnosis not present

## 2023-07-13 DIAGNOSIS — F4312 Post-traumatic stress disorder, chronic: Secondary | ICD-10-CM | POA: Diagnosis not present

## 2023-07-20 DIAGNOSIS — F4312 Post-traumatic stress disorder, chronic: Secondary | ICD-10-CM | POA: Diagnosis not present

## 2023-07-20 DIAGNOSIS — F331 Major depressive disorder, recurrent, moderate: Secondary | ICD-10-CM | POA: Diagnosis not present

## 2023-07-26 DIAGNOSIS — J069 Acute upper respiratory infection, unspecified: Secondary | ICD-10-CM | POA: Diagnosis not present

## 2023-07-27 DIAGNOSIS — F331 Major depressive disorder, recurrent, moderate: Secondary | ICD-10-CM | POA: Diagnosis not present

## 2023-07-27 DIAGNOSIS — F4312 Post-traumatic stress disorder, chronic: Secondary | ICD-10-CM | POA: Diagnosis not present

## 2023-08-02 ENCOUNTER — Telehealth: Payer: Self-pay | Admitting: Behavioral Health

## 2023-08-02 ENCOUNTER — Other Ambulatory Visit: Payer: Self-pay

## 2023-08-02 NOTE — Telephone Encounter (Signed)
Pended.

## 2023-08-02 NOTE — Telephone Encounter (Signed)
  Pt is calling to request both RF on Adderall 5mg  and the Xr15mg .Please send to :   [facility] [place] [address] [place], [place]

## 2023-08-03 MED ORDER — AMPHETAMINE-DEXTROAMPHET ER 15 MG PO CP24: 15.0000 mg | ORAL_CAPSULE | ORAL | 0 refills | Status: DC

## 2023-08-03 MED ORDER — AMPHETAMINE-DEXTROAMPHETAMINE 5 MG PO TABS: ORAL_TABLET | ORAL | 0 refills | Status: DC

## 2023-08-10 DIAGNOSIS — F331 Major depressive disorder, recurrent, moderate: Secondary | ICD-10-CM | POA: Diagnosis not present

## 2023-08-10 DIAGNOSIS — F4312 Post-traumatic stress disorder, chronic: Secondary | ICD-10-CM | POA: Diagnosis not present

## 2023-08-13 ENCOUNTER — Encounter: Payer: Self-pay | Admitting: Psychiatry

## 2023-08-13 ENCOUNTER — Telehealth: Payer: BC Managed Care – PPO | Admitting: Psychiatry

## 2023-08-13 DIAGNOSIS — F902 Attention-deficit hyperactivity disorder, combined type: Secondary | ICD-10-CM

## 2023-08-13 DIAGNOSIS — F4310 Post-traumatic stress disorder, unspecified: Secondary | ICD-10-CM

## 2023-08-13 DIAGNOSIS — F411 Generalized anxiety disorder: Secondary | ICD-10-CM

## 2023-08-13 MED ORDER — AMPHETAMINE-DEXTROAMPHETAMINE 5 MG PO TABS: ORAL_TABLET | ORAL | 0 refills | Status: DC

## 2023-08-13 MED ORDER — AMPHETAMINE-DEXTROAMPHET ER 15 MG PO CP24: 15.0000 mg | ORAL_CAPSULE | ORAL | 0 refills | Status: DC
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
## 2023-08-17 DIAGNOSIS — F4312 Post-traumatic stress disorder, chronic: Secondary | ICD-10-CM | POA: Diagnosis not present

## 2023-08-17 DIAGNOSIS — F331 Major depressive disorder, recurrent, moderate: Secondary | ICD-10-CM | POA: Diagnosis not present

## 2023-08-24 DIAGNOSIS — F331 Major depressive disorder, recurrent, moderate: Secondary | ICD-10-CM | POA: Diagnosis not present

## 2023-08-24 DIAGNOSIS — F4312 Post-traumatic stress disorder, chronic: Secondary | ICD-10-CM | POA: Diagnosis not present

## 2023-08-31 DIAGNOSIS — F331 Major depressive disorder, recurrent, moderate: Secondary | ICD-10-CM | POA: Diagnosis not present

## 2023-08-31 DIAGNOSIS — F4312 Post-traumatic stress disorder, chronic: Secondary | ICD-10-CM | POA: Diagnosis not present

## 2023-09-03 DIAGNOSIS — Z0000 Encounter for general adult medical examination without abnormal findings: Secondary | ICD-10-CM | POA: Diagnosis not present

## 2023-09-07 DIAGNOSIS — F4312 Post-traumatic stress disorder, chronic: Secondary | ICD-10-CM | POA: Diagnosis not present

## 2023-09-07 DIAGNOSIS — F331 Major depressive disorder, recurrent, moderate: Secondary | ICD-10-CM | POA: Diagnosis not present

## 2023-09-09 DIAGNOSIS — J45909 Unspecified asthma, uncomplicated: Secondary | ICD-10-CM | POA: Diagnosis not present

## 2023-09-09 DIAGNOSIS — R7989 Other specified abnormal findings of blood chemistry: Secondary | ICD-10-CM | POA: Diagnosis not present

## 2023-09-09 DIAGNOSIS — Z1331 Encounter for screening for depression: Secondary | ICD-10-CM | POA: Diagnosis not present

## 2023-09-09 DIAGNOSIS — Z23 Encounter for immunization: Secondary | ICD-10-CM | POA: Diagnosis not present

## 2023-09-09 DIAGNOSIS — C8199 Hodgkin lymphoma, unspecified, extranodal and solid organ sites: Secondary | ICD-10-CM | POA: Diagnosis not present

## 2023-09-09 DIAGNOSIS — Z1339 Encounter for screening examination for other mental health and behavioral disorders: Secondary | ICD-10-CM | POA: Diagnosis not present

## 2023-09-09 DIAGNOSIS — Z0000 Encounter for general adult medical examination without abnormal findings: Secondary | ICD-10-CM | POA: Diagnosis not present

## 2023-09-21 DIAGNOSIS — J069 Acute upper respiratory infection, unspecified: Secondary | ICD-10-CM | POA: Diagnosis not present

## 2023-09-28 DIAGNOSIS — F331 Major depressive disorder, recurrent, moderate: Secondary | ICD-10-CM | POA: Diagnosis not present

## 2023-09-28 DIAGNOSIS — F4312 Post-traumatic stress disorder, chronic: Secondary | ICD-10-CM | POA: Diagnosis not present

## 2023-10-05 DIAGNOSIS — F4312 Post-traumatic stress disorder, chronic: Secondary | ICD-10-CM | POA: Diagnosis not present

## 2023-10-05 DIAGNOSIS — F331 Major depressive disorder, recurrent, moderate: Secondary | ICD-10-CM | POA: Diagnosis not present

## 2023-10-12 DIAGNOSIS — F4312 Post-traumatic stress disorder, chronic: Secondary | ICD-10-CM | POA: Diagnosis not present

## 2023-10-12 DIAGNOSIS — F331 Major depressive disorder, recurrent, moderate: Secondary | ICD-10-CM | POA: Diagnosis not present

## 2023-10-19 DIAGNOSIS — F4312 Post-traumatic stress disorder, chronic: Secondary | ICD-10-CM | POA: Diagnosis not present

## 2023-10-19 DIAGNOSIS — F331 Major depressive disorder, recurrent, moderate: Secondary | ICD-10-CM | POA: Diagnosis not present

## 2023-10-26 DIAGNOSIS — F4312 Post-traumatic stress disorder, chronic: Secondary | ICD-10-CM | POA: Diagnosis not present

## 2023-10-26 DIAGNOSIS — F331 Major depressive disorder, recurrent, moderate: Secondary | ICD-10-CM | POA: Diagnosis not present

## 2023-11-09 DIAGNOSIS — F331 Major depressive disorder, recurrent, moderate: Secondary | ICD-10-CM | POA: Diagnosis not present

## 2023-11-09 DIAGNOSIS — F4312 Post-traumatic stress disorder, chronic: Secondary | ICD-10-CM | POA: Diagnosis not present

## 2023-11-16 DIAGNOSIS — F331 Major depressive disorder, recurrent, moderate: Secondary | ICD-10-CM | POA: Diagnosis not present

## 2023-11-16 DIAGNOSIS — F4312 Post-traumatic stress disorder, chronic: Secondary | ICD-10-CM | POA: Diagnosis not present

## 2023-11-30 DIAGNOSIS — F331 Major depressive disorder, recurrent, moderate: Secondary | ICD-10-CM | POA: Diagnosis not present

## 2023-11-30 DIAGNOSIS — F4312 Post-traumatic stress disorder, chronic: Secondary | ICD-10-CM | POA: Diagnosis not present

## 2023-12-13 ENCOUNTER — Other Ambulatory Visit: Payer: Self-pay

## 2023-12-13 ENCOUNTER — Telehealth: Payer: Self-pay | Admitting: Psychiatry

## 2023-12-13 NOTE — Telephone Encounter (Signed)
 Arnetra called to request refill of both her Adderalls = 5mg  and 15mg .  Appt 01/21/24.  Send to [facility] on [address], [place]

## 2023-12-13 NOTE — Telephone Encounter (Signed)
 Pended both doses of Adderall. Pharmacy is in [place], not [place].

## 2023-12-14 DIAGNOSIS — F4312 Post-traumatic stress disorder, chronic: Secondary | ICD-10-CM | POA: Diagnosis not present

## 2023-12-14 DIAGNOSIS — F331 Major depressive disorder, recurrent, moderate: Secondary | ICD-10-CM | POA: Diagnosis not present

## 2023-12-14 MED ORDER — AMPHETAMINE-DEXTROAMPHETAMINE 5 MG PO TABS: ORAL_TABLET | ORAL | 0 refills | Status: DC

## 2023-12-14 MED ORDER — AMPHETAMINE-DEXTROAMPHET ER 15 MG PO CP24: 15.0000 mg | ORAL_CAPSULE | ORAL | 0 refills | Status: DC

## 2023-12-21 DIAGNOSIS — F331 Major depressive disorder, recurrent, moderate: Secondary | ICD-10-CM | POA: Diagnosis not present

## 2023-12-21 DIAGNOSIS — F4312 Post-traumatic stress disorder, chronic: Secondary | ICD-10-CM | POA: Diagnosis not present

## 2023-12-28 DIAGNOSIS — F4312 Post-traumatic stress disorder, chronic: Secondary | ICD-10-CM | POA: Diagnosis not present

## 2023-12-28 DIAGNOSIS — F331 Major depressive disorder, recurrent, moderate: Secondary | ICD-10-CM | POA: Diagnosis not present

## 2023-12-31 DIAGNOSIS — R87810 Cervical high risk human papillomavirus (HPV) DNA test positive: Secondary | ICD-10-CM | POA: Diagnosis not present

## 2023-12-31 DIAGNOSIS — Z124 Encounter for screening for malignant neoplasm of cervix: Secondary | ICD-10-CM | POA: Diagnosis not present

## 2023-12-31 DIAGNOSIS — Z1331 Encounter for screening for depression: Secondary | ICD-10-CM | POA: Diagnosis not present

## 2023-12-31 DIAGNOSIS — Z1159 Encounter for screening for other viral diseases: Secondary | ICD-10-CM | POA: Diagnosis not present

## 2023-12-31 DIAGNOSIS — Z113 Encounter for screening for infections with a predominantly sexual mode of transmission: Secondary | ICD-10-CM | POA: Diagnosis not present

## 2023-12-31 DIAGNOSIS — Z01419 Encounter for gynecological examination (general) (routine) without abnormal findings: Secondary | ICD-10-CM | POA: Diagnosis not present

## 2024-01-04 DIAGNOSIS — F4312 Post-traumatic stress disorder, chronic: Secondary | ICD-10-CM | POA: Diagnosis not present

## 2024-01-04 DIAGNOSIS — F331 Major depressive disorder, recurrent, moderate: Secondary | ICD-10-CM | POA: Diagnosis not present

## 2024-01-06 DIAGNOSIS — R591 Generalized enlarged lymph nodes: Secondary | ICD-10-CM | POA: Diagnosis not present

## 2024-01-06 DIAGNOSIS — C8199 Hodgkin lymphoma, unspecified, extranodal and solid organ sites: Secondary | ICD-10-CM | POA: Diagnosis not present

## 2024-01-11 DIAGNOSIS — F4312 Post-traumatic stress disorder, chronic: Secondary | ICD-10-CM | POA: Diagnosis not present

## 2024-01-21 ENCOUNTER — Encounter: Payer: Self-pay | Admitting: Psychiatry

## 2024-01-21 ENCOUNTER — Telehealth (INDEPENDENT_AMBULATORY_CARE_PROVIDER_SITE_OTHER): Admitting: Psychiatry

## 2024-01-21 DIAGNOSIS — F411 Generalized anxiety disorder: Secondary | ICD-10-CM

## 2024-01-21 DIAGNOSIS — F902 Attention-deficit hyperactivity disorder, combined type: Secondary | ICD-10-CM | POA: Diagnosis not present

## 2024-01-21 DIAGNOSIS — F4310 Post-traumatic stress disorder, unspecified: Secondary | ICD-10-CM | POA: Diagnosis not present

## 2024-01-21 MED ORDER — AMPHETAMINE-DEXTROAMPHETAMINE 5 MG PO TABS: ORAL_TABLET | ORAL | 0 refills | Status: DC

## 2024-01-21 MED ORDER — AMPHETAMINE-DEXTROAMPHET ER 15 MG PO CP24: 15.0000 mg | ORAL_CAPSULE | ORAL | 0 refills | Status: DC
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
## 2024-01-25 DIAGNOSIS — F331 Major depressive disorder, recurrent, moderate: Secondary | ICD-10-CM | POA: Diagnosis not present

## 2024-01-25 DIAGNOSIS — F4312 Post-traumatic stress disorder, chronic: Secondary | ICD-10-CM | POA: Diagnosis not present

## 2024-01-31 DIAGNOSIS — R599 Enlarged lymph nodes, unspecified: Secondary | ICD-10-CM | POA: Diagnosis not present

## 2024-01-31 DIAGNOSIS — R591 Generalized enlarged lymph nodes: Secondary | ICD-10-CM | POA: Diagnosis not present

## 2024-02-01 DIAGNOSIS — F331 Major depressive disorder, recurrent, moderate: Secondary | ICD-10-CM | POA: Diagnosis not present

## 2024-02-01 DIAGNOSIS — F4312 Post-traumatic stress disorder, chronic: Secondary | ICD-10-CM | POA: Diagnosis not present

## 2024-02-08 DIAGNOSIS — F4312 Post-traumatic stress disorder, chronic: Secondary | ICD-10-CM | POA: Diagnosis not present

## 2024-02-08 DIAGNOSIS — F331 Major depressive disorder, recurrent, moderate: Secondary | ICD-10-CM | POA: Diagnosis not present

## 2024-02-12 DIAGNOSIS — Z8571 Personal history of Hodgkin lymphoma: Secondary | ICD-10-CM | POA: Diagnosis not present

## 2024-02-12 DIAGNOSIS — R599 Enlarged lymph nodes, unspecified: Secondary | ICD-10-CM | POA: Diagnosis not present

## 2024-02-12 DIAGNOSIS — R590 Localized enlarged lymph nodes: Secondary | ICD-10-CM | POA: Diagnosis not present

## 2024-02-17 DIAGNOSIS — J069 Acute upper respiratory infection, unspecified: Secondary | ICD-10-CM | POA: Diagnosis not present

## 2024-02-17 DIAGNOSIS — U071 COVID-19: Secondary | ICD-10-CM | POA: Diagnosis not present

## 2024-02-29 DIAGNOSIS — F331 Major depressive disorder, recurrent, moderate: Secondary | ICD-10-CM | POA: Diagnosis not present

## 2024-02-29 DIAGNOSIS — F4312 Post-traumatic stress disorder, chronic: Secondary | ICD-10-CM | POA: Diagnosis not present

## 2024-03-07 DIAGNOSIS — F331 Major depressive disorder, recurrent, moderate: Secondary | ICD-10-CM | POA: Diagnosis not present

## 2024-03-07 DIAGNOSIS — F4312 Post-traumatic stress disorder, chronic: Secondary | ICD-10-CM | POA: Diagnosis not present

## 2024-03-14 DIAGNOSIS — F4312 Post-traumatic stress disorder, chronic: Secondary | ICD-10-CM | POA: Diagnosis not present

## 2024-03-14 DIAGNOSIS — F331 Major depressive disorder, recurrent, moderate: Secondary | ICD-10-CM | POA: Diagnosis not present

## 2024-03-27 DIAGNOSIS — J069 Acute upper respiratory infection, unspecified: Secondary | ICD-10-CM | POA: Diagnosis not present

## 2024-04-21 DIAGNOSIS — F4312 Post-traumatic stress disorder, chronic: Secondary | ICD-10-CM | POA: Diagnosis not present

## 2024-04-21 DIAGNOSIS — F331 Major depressive disorder, recurrent, moderate: Secondary | ICD-10-CM | POA: Diagnosis not present

## 2024-04-26 DIAGNOSIS — J309 Allergic rhinitis, unspecified: Secondary | ICD-10-CM | POA: Diagnosis not present

## 2024-04-28 ENCOUNTER — Encounter: Payer: Self-pay | Admitting: Psychiatry

## 2024-04-28 ENCOUNTER — Telehealth: Admitting: Psychiatry

## 2024-04-28 DIAGNOSIS — F4310 Post-traumatic stress disorder, unspecified: Secondary | ICD-10-CM

## 2024-04-28 DIAGNOSIS — F902 Attention-deficit hyperactivity disorder, combined type: Secondary | ICD-10-CM | POA: Diagnosis not present

## 2024-04-28 DIAGNOSIS — F411 Generalized anxiety disorder: Secondary | ICD-10-CM | POA: Diagnosis not present

## 2024-04-28 MED ORDER — METHYLPHENIDATE HCL ER (OSM) 36 MG PO TBCR: 36.0000 mg | EXTENDED_RELEASE_TABLET | Freq: Every day | ORAL | 0 refills | Status: DC
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
## 2024-05-02 DIAGNOSIS — F331 Major depressive disorder, recurrent, moderate: Secondary | ICD-10-CM | POA: Diagnosis not present

## 2024-05-02 DIAGNOSIS — F4312 Post-traumatic stress disorder, chronic: Secondary | ICD-10-CM | POA: Diagnosis not present

## 2024-05-10 DIAGNOSIS — J069 Acute upper respiratory infection, unspecified: Secondary | ICD-10-CM | POA: Diagnosis not present

## 2024-05-16 DIAGNOSIS — F4312 Post-traumatic stress disorder, chronic: Secondary | ICD-10-CM | POA: Diagnosis not present

## 2024-05-16 DIAGNOSIS — F331 Major depressive disorder, recurrent, moderate: Secondary | ICD-10-CM | POA: Diagnosis not present

## 2024-05-30 DIAGNOSIS — F4312 Post-traumatic stress disorder, chronic: Secondary | ICD-10-CM | POA: Diagnosis not present

## 2024-05-30 DIAGNOSIS — F331 Major depressive disorder, recurrent, moderate: Secondary | ICD-10-CM | POA: Diagnosis not present

## 2024-06-09 ENCOUNTER — Telehealth: Admitting: Psychiatry

## 2024-06-09 ENCOUNTER — Encounter: Payer: Self-pay | Admitting: Psychiatry

## 2024-06-09 DIAGNOSIS — F902 Attention-deficit hyperactivity disorder, combined type: Secondary | ICD-10-CM | POA: Diagnosis not present

## 2024-06-09 DIAGNOSIS — F411 Generalized anxiety disorder: Secondary | ICD-10-CM

## 2024-06-09 DIAGNOSIS — F4310 Post-traumatic stress disorder, unspecified: Secondary | ICD-10-CM

## 2024-06-09 MED ORDER — METHYLPHENIDATE HCL ER (OSM) 27 MG PO TBCR: 27.0000 mg | EXTENDED_RELEASE_TABLET | Freq: Every day | ORAL | 0 refills | Status: DC

## 2024-06-09 MED ORDER — METHYLPHENIDATE HCL 10 MG PO TABS: 10.0000 mg | ORAL_TABLET | Freq: Every day | ORAL | 0 refills | Status: DC

## 2024-06-09 MED ORDER — METHYLPHENIDATE HCL ER (OSM) 27 MG PO TBCR: 27.0000 mg | EXTENDED_RELEASE_TABLET | ORAL | 0 refills | Status: DC
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
## 2024-06-16 DIAGNOSIS — Z113 Encounter for screening for infections with a predominantly sexual mode of transmission: Secondary | ICD-10-CM | POA: Diagnosis not present

## 2024-07-04 DIAGNOSIS — J069 Acute upper respiratory infection, unspecified: Secondary | ICD-10-CM | POA: Diagnosis not present

## 2024-07-10 DIAGNOSIS — J019 Acute sinusitis, unspecified: Secondary | ICD-10-CM | POA: Diagnosis not present

## 2024-07-24 DIAGNOSIS — F4312 Post-traumatic stress disorder, chronic: Secondary | ICD-10-CM | POA: Diagnosis not present

## 2024-08-07 DIAGNOSIS — F4312 Post-traumatic stress disorder, chronic: Secondary | ICD-10-CM | POA: Diagnosis not present

## 2024-08-11 ENCOUNTER — Telehealth (INDEPENDENT_AMBULATORY_CARE_PROVIDER_SITE_OTHER): Admitting: Psychiatry

## 2024-08-11 ENCOUNTER — Encounter: Payer: Self-pay | Admitting: Psychiatry

## 2024-08-11 DIAGNOSIS — F4310 Post-traumatic stress disorder, unspecified: Secondary | ICD-10-CM

## 2024-08-11 DIAGNOSIS — F902 Attention-deficit hyperactivity disorder, combined type: Secondary | ICD-10-CM | POA: Diagnosis not present

## 2024-08-11 DIAGNOSIS — F411 Generalized anxiety disorder: Secondary | ICD-10-CM

## 2024-08-11 MED ORDER — LISDEXAMFETAMINE DIMESYLATE 30 MG PO CAPS: 30.0000 mg | ORAL_CAPSULE | Freq: Every day | ORAL | 0 refills | Status: DC
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
## 2024-08-16 DIAGNOSIS — R197 Diarrhea, unspecified: Secondary | ICD-10-CM | POA: Diagnosis not present

## 2024-08-21 DIAGNOSIS — F4312 Post-traumatic stress disorder, chronic: Secondary | ICD-10-CM | POA: Diagnosis not present

## 2024-08-21 DIAGNOSIS — F331 Major depressive disorder, recurrent, moderate: Secondary | ICD-10-CM | POA: Diagnosis not present

## 2024-09-04 DIAGNOSIS — F4312 Post-traumatic stress disorder, chronic: Secondary | ICD-10-CM | POA: Diagnosis not present

## 2024-09-04 DIAGNOSIS — F331 Major depressive disorder, recurrent, moderate: Secondary | ICD-10-CM | POA: Diagnosis not present

## 2024-09-18 DIAGNOSIS — F331 Major depressive disorder, recurrent, moderate: Secondary | ICD-10-CM | POA: Diagnosis not present

## 2024-09-18 DIAGNOSIS — F4312 Post-traumatic stress disorder, chronic: Secondary | ICD-10-CM | POA: Diagnosis not present

## 2024-09-18 DIAGNOSIS — J069 Acute upper respiratory infection, unspecified: Secondary | ICD-10-CM | POA: Diagnosis not present

## 2024-09-19 DIAGNOSIS — N898 Other specified noninflammatory disorders of vagina: Secondary | ICD-10-CM | POA: Diagnosis not present

## 2024-09-19 DIAGNOSIS — R7989 Other specified abnormal findings of blood chemistry: Secondary | ICD-10-CM | POA: Diagnosis not present

## 2024-09-19 DIAGNOSIS — Z1331 Encounter for screening for depression: Secondary | ICD-10-CM | POA: Diagnosis not present

## 2024-09-19 DIAGNOSIS — Z0000 Encounter for general adult medical examination without abnormal findings: Secondary | ICD-10-CM | POA: Diagnosis not present

## 2024-09-19 DIAGNOSIS — R7889 Finding of other specified substances, not normally found in blood: Secondary | ICD-10-CM | POA: Diagnosis not present

## 2024-09-19 DIAGNOSIS — C8199 Hodgkin lymphoma, unspecified, extranodal and solid organ sites: Secondary | ICD-10-CM | POA: Diagnosis not present

## 2024-09-19 DIAGNOSIS — Z1339 Encounter for screening examination for other mental health and behavioral disorders: Secondary | ICD-10-CM | POA: Diagnosis not present

## 2024-09-19 DIAGNOSIS — Z23 Encounter for immunization: Secondary | ICD-10-CM | POA: Diagnosis not present

## 2024-10-20 ENCOUNTER — Encounter: Payer: Self-pay | Admitting: Psychiatry

## 2024-10-20 ENCOUNTER — Telehealth: Admitting: Psychiatry

## 2024-10-20 DIAGNOSIS — F4310 Post-traumatic stress disorder, unspecified: Secondary | ICD-10-CM

## 2024-10-20 DIAGNOSIS — F902 Attention-deficit hyperactivity disorder, combined type: Secondary | ICD-10-CM

## 2024-10-20 DIAGNOSIS — F411 Generalized anxiety disorder: Secondary | ICD-10-CM

## 2024-10-20 MED ORDER — LISDEXAMFETAMINE DIMESYLATE 40 MG PO CAPS: 40.0000 mg | ORAL_CAPSULE | Freq: Every day | ORAL | 0 refills | Status: AC

## 2024-10-20 MED ORDER — FLUOXETINE HCL 20 MG PO CAPS: 20.0000 mg | ORAL_CAPSULE | Freq: Every day | ORAL | 3 refills | Status: AC
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
## 2024-12-01 ENCOUNTER — Telehealth: Admitting: Psychiatry
